# Patient Record
Sex: Male | Born: 1960 | Race: White | Hispanic: No | Marital: Married | Smoking: Never smoker
Health system: Southern US, Community
[De-identification: ages and names within clinical notes are randomized; demographics above are authoritative.]

## PROBLEM LIST (undated history)

## (undated) DIAGNOSIS — H8109 Meniere's disease, unspecified ear: Secondary | ICD-10-CM

## (undated) HISTORY — PX: TONSILLECTOMY: SUR1361

## (undated) HISTORY — PX: COLONOSCOPY: SHX174

---

## 2002-02-16 ENCOUNTER — Inpatient Hospital Stay (HOSPITAL_COMMUNITY): Admission: EM | Admit: 2002-02-16 | Discharge: 2002-02-17 | Payer: Self-pay | Admitting: Emergency Medicine

## 2002-02-16 ENCOUNTER — Encounter: Payer: Self-pay | Admitting: Emergency Medicine

## 2002-03-01 ENCOUNTER — Encounter: Admission: RE | Admit: 2002-03-01 | Discharge: 2002-03-17 | Payer: Self-pay | Admitting: Neurosurgery

## 2012-01-21 ENCOUNTER — Other Ambulatory Visit: Payer: Self-pay | Admitting: Otolaryngology

## 2012-01-21 DIAGNOSIS — H903 Sensorineural hearing loss, bilateral: Secondary | ICD-10-CM

## 2012-01-21 DIAGNOSIS — H905 Unspecified sensorineural hearing loss: Secondary | ICD-10-CM

## 2015-08-02 DIAGNOSIS — R51 Headache: Secondary | ICD-10-CM | POA: Diagnosis not present

## 2015-08-17 DIAGNOSIS — R42 Dizziness and giddiness: Secondary | ICD-10-CM | POA: Diagnosis not present

## 2015-08-17 DIAGNOSIS — H9122 Sudden idiopathic hearing loss, left ear: Secondary | ICD-10-CM | POA: Diagnosis not present

## 2015-08-17 DIAGNOSIS — H903 Sensorineural hearing loss, bilateral: Secondary | ICD-10-CM | POA: Diagnosis not present

## 2015-08-17 DIAGNOSIS — H9201 Otalgia, right ear: Secondary | ICD-10-CM | POA: Diagnosis not present

## 2015-08-17 DIAGNOSIS — H9312 Tinnitus, left ear: Secondary | ICD-10-CM | POA: Diagnosis not present

## 2015-08-21 ENCOUNTER — Other Ambulatory Visit: Payer: Self-pay | Admitting: Otolaryngology

## 2015-08-21 DIAGNOSIS — H9122 Sudden idiopathic hearing loss, left ear: Secondary | ICD-10-CM

## 2015-09-18 DIAGNOSIS — Z23 Encounter for immunization: Secondary | ICD-10-CM | POA: Diagnosis not present

## 2015-11-13 DIAGNOSIS — Z0000 Encounter for general adult medical examination without abnormal findings: Secondary | ICD-10-CM | POA: Diagnosis not present

## 2015-11-13 DIAGNOSIS — H9192 Unspecified hearing loss, left ear: Secondary | ICD-10-CM | POA: Diagnosis not present

## 2015-11-13 DIAGNOSIS — E7800 Pure hypercholesterolemia, unspecified: Secondary | ICD-10-CM | POA: Diagnosis not present

## 2015-11-13 DIAGNOSIS — F5109 Other insomnia not due to a substance or known physiological condition: Secondary | ICD-10-CM | POA: Diagnosis not present

## 2015-11-20 ENCOUNTER — Other Ambulatory Visit: Payer: Self-pay | Admitting: Family Medicine

## 2015-11-20 DIAGNOSIS — H9192 Unspecified hearing loss, left ear: Secondary | ICD-10-CM

## 2015-11-20 DIAGNOSIS — H9312 Tinnitus, left ear: Secondary | ICD-10-CM

## 2015-12-09 ENCOUNTER — Ambulatory Visit
Admission: RE | Admit: 2015-12-09 | Discharge: 2015-12-09 | Disposition: A | Discharge status: Home / Self Care | Payer: Self-pay | Source: Ambulatory Visit | Attending: Family Medicine | Admitting: Family Medicine

## 2015-12-09 DIAGNOSIS — H9312 Tinnitus, left ear: Secondary | ICD-10-CM

## 2015-12-09 DIAGNOSIS — H9192 Unspecified hearing loss, left ear: Secondary | ICD-10-CM

## 2015-12-09 MED ORDER — GADOBENATE DIMEGLUMINE 529 MG/ML IV SOLN
15.0000 mL | Freq: Once | INTRAVENOUS | Status: AC | PRN
  Administered 2015-12-09: 15 mL via INTRAVENOUS

## 2016-06-26 DIAGNOSIS — Z0000 Encounter for general adult medical examination without abnormal findings: Secondary | ICD-10-CM | POA: Diagnosis not present

## 2016-06-26 DIAGNOSIS — Z125 Encounter for screening for malignant neoplasm of prostate: Secondary | ICD-10-CM | POA: Diagnosis not present

## 2016-06-26 DIAGNOSIS — H9192 Unspecified hearing loss, left ear: Secondary | ICD-10-CM | POA: Diagnosis not present

## 2016-06-26 DIAGNOSIS — E7800 Pure hypercholesterolemia, unspecified: Secondary | ICD-10-CM | POA: Diagnosis not present

## 2016-06-26 DIAGNOSIS — F5109 Other insomnia not due to a substance or known physiological condition: Secondary | ICD-10-CM | POA: Diagnosis not present

## 2016-08-03 DIAGNOSIS — R42 Dizziness and giddiness: Secondary | ICD-10-CM | POA: Diagnosis not present

## 2016-08-03 DIAGNOSIS — R404 Transient alteration of awareness: Secondary | ICD-10-CM | POA: Diagnosis not present

## 2016-10-18 DIAGNOSIS — Z23 Encounter for immunization: Secondary | ICD-10-CM | POA: Diagnosis not present

## 2017-03-10 DIAGNOSIS — M25511 Pain in right shoulder: Secondary | ICD-10-CM | POA: Diagnosis not present

## 2017-03-10 DIAGNOSIS — M25512 Pain in left shoulder: Secondary | ICD-10-CM | POA: Diagnosis not present

## 2017-03-10 DIAGNOSIS — M533 Sacrococcygeal disorders, not elsewhere classified: Secondary | ICD-10-CM | POA: Diagnosis not present

## 2017-03-23 DIAGNOSIS — M7701 Medial epicondylitis, right elbow: Secondary | ICD-10-CM | POA: Diagnosis not present

## 2017-03-23 DIAGNOSIS — M25511 Pain in right shoulder: Secondary | ICD-10-CM | POA: Diagnosis not present

## 2017-04-08 DIAGNOSIS — M67911 Unspecified disorder of synovium and tendon, right shoulder: Secondary | ICD-10-CM | POA: Diagnosis not present

## 2017-04-08 DIAGNOSIS — M67912 Unspecified disorder of synovium and tendon, left shoulder: Secondary | ICD-10-CM | POA: Diagnosis not present

## 2017-04-13 DIAGNOSIS — F341 Dysthymic disorder: Secondary | ICD-10-CM | POA: Diagnosis not present

## 2017-04-13 DIAGNOSIS — F411 Generalized anxiety disorder: Secondary | ICD-10-CM | POA: Diagnosis not present

## 2017-04-15 DIAGNOSIS — M7541 Impingement syndrome of right shoulder: Secondary | ICD-10-CM | POA: Diagnosis not present

## 2017-04-15 DIAGNOSIS — M25512 Pain in left shoulder: Secondary | ICD-10-CM | POA: Diagnosis not present

## 2017-04-15 DIAGNOSIS — M25511 Pain in right shoulder: Secondary | ICD-10-CM | POA: Diagnosis not present

## 2017-04-15 DIAGNOSIS — M7542 Impingement syndrome of left shoulder: Secondary | ICD-10-CM | POA: Diagnosis not present

## 2017-04-15 IMAGING — MR MR HEAD WO/W CM
11 series · 37 of 48 positions shown · IV contrast (multihance)
Comparison: None.

CLINICAL DATA: 55-year-old male with 2 months of left side hearing
loss and tinnitus. No known injury. Initial encounter.

EXAM:
MRI HEAD WITHOUT AND WITH CONTRAST
TECHNIQUE: Multiplanar, multiecho pulse sequences of the brain and surrounding
structures were obtained without and with intravenous contrast.
CONTRAST:  15mL MULTIHANCE GADOBENATE DIMEGLUMINE 529 MG/ML IV SOLN

[Series 3: T1 · sagittal · 5.0mm · 0.45mm/px · 4 of 21 slices shown (1 of 3)]
[im 1/21]
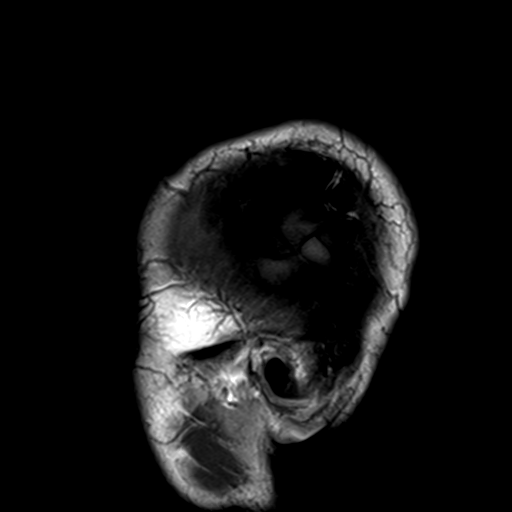
[im 7/21]
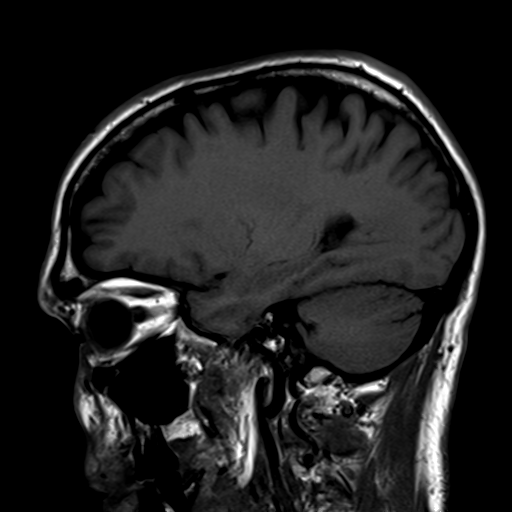
[im 14/21]
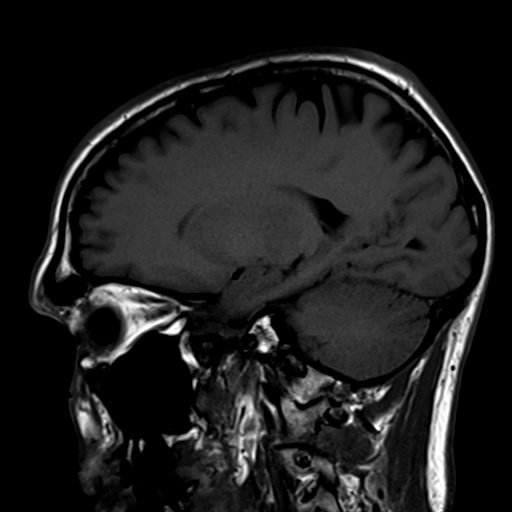
[im 21/21]
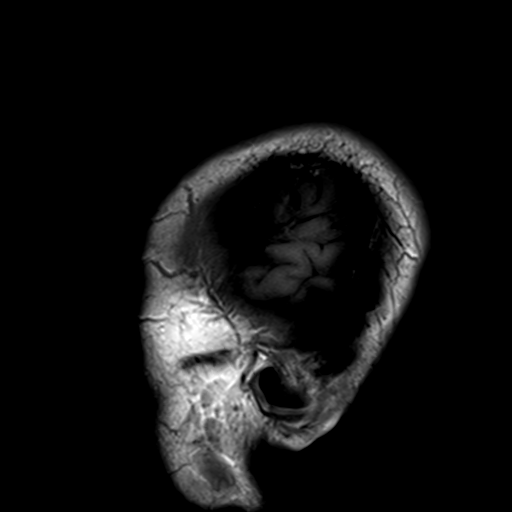

[Series 4: DWI · axial · 3.0mm · 1.80mm/px · z∈[-29,+117]mm · 8 of 100 slices shown (1 of 2)]
[im 1/100]
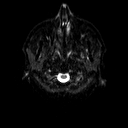
[im 16/100]
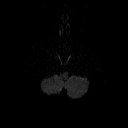
[im 31/100]
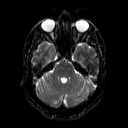
[im 46/100]
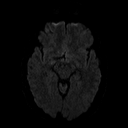
[im 54/100]
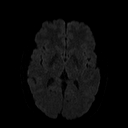
[im 69/100]
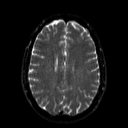
[im 84/100]
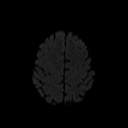
[im 100/100]
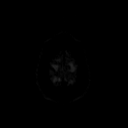

[Series 5: DWI · axial · 3.0mm · 1.80mm/px · z∈[-29,+117]mm · 6 of 48 slices shown (2 of 2)]
[im 1/48]
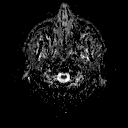
[im 10/48]
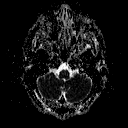
[im 19/48]
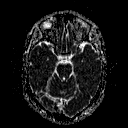
[im 29/48]
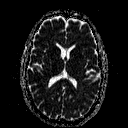
[im 38/48]
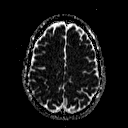
[im 48/48]
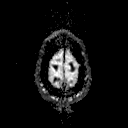

[Series 6: T2 · axial · 5.0mm · 0.45mm/px · z∈[-27,+115]mm · 3 of 23 slices shown]
[im 1/23]
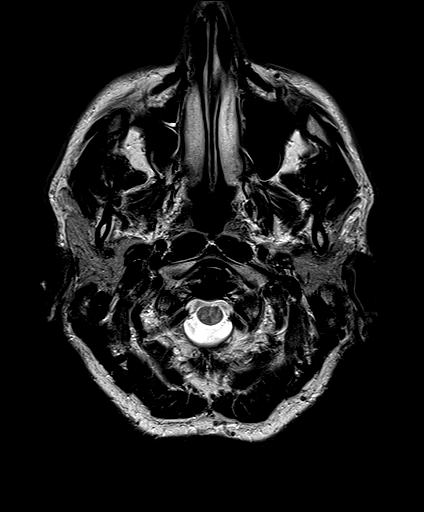
[im 12/23]
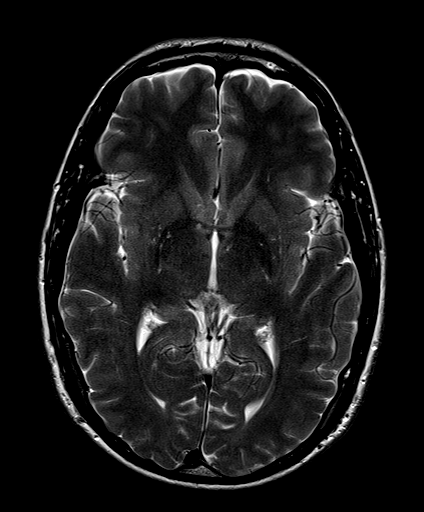
[im 23/23]
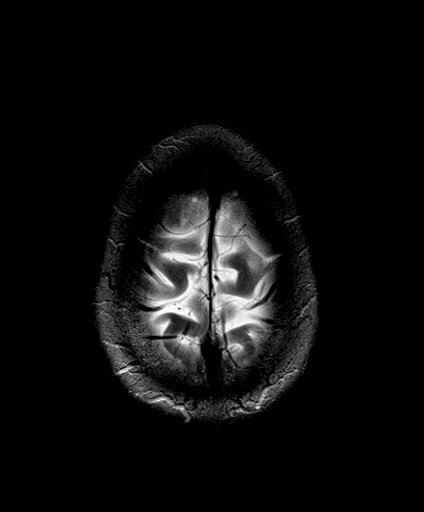

[Series 7: FLAIR · axial · 5.0mm · 0.45mm/px · z∈[-27,+116]mm · 3 of 23 slices shown]
[im 1/23]
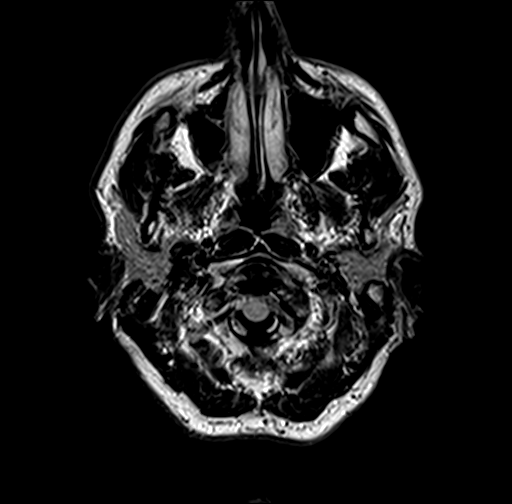
[im 12/23]
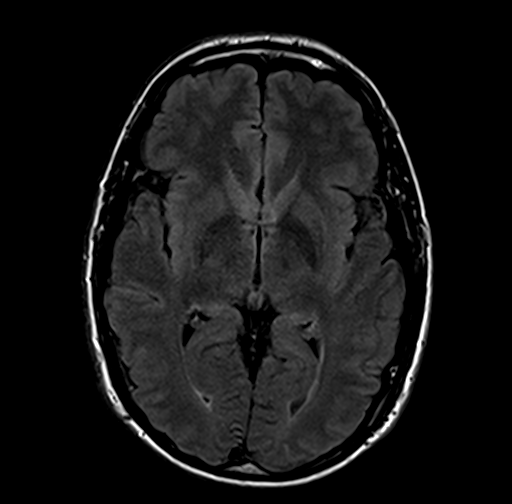
[im 23/23]
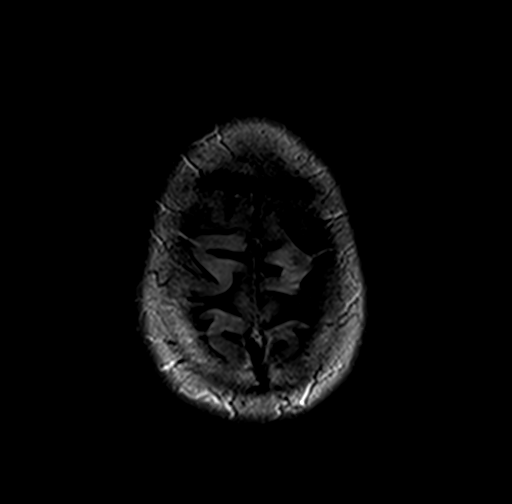

[Series 8: T1 · coronal · 3.0mm · 0.35mm/px · 1 of 11 slices shown (2 of 3)]
[im 1/11]
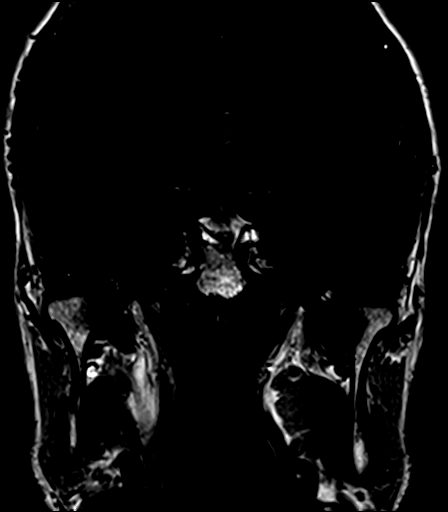

[Series 9: T1 · axial · 3.0mm · 0.35mm/px · 1 of 11 slices shown (3 of 3)]
[im 1/11]
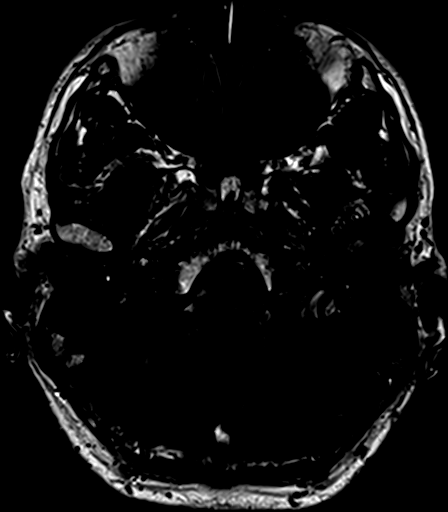

[Series 10: bSSFP · axial · 1.0mm · 0.56mm/px · z∈[-6,+29]mm · 5 of 36 slices shown]
[im 1/36]
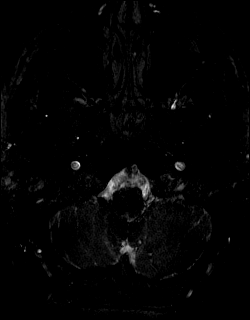
[im 9/36]
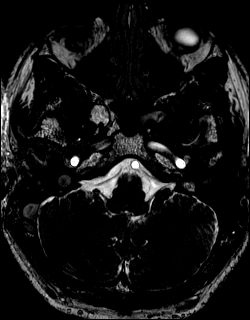
[im 18/36]
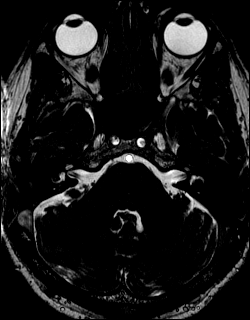
[im 27/36]
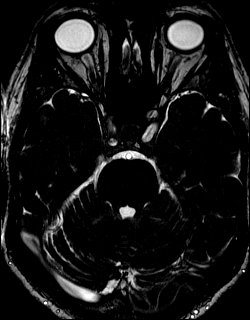
[im 36/36]
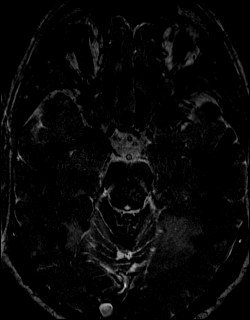

[Series 11: T1 post-contrast · coronal · 3.0mm · 0.35mm/px · 1 of 11 slices shown (1 of 2)]
[im 1/11]
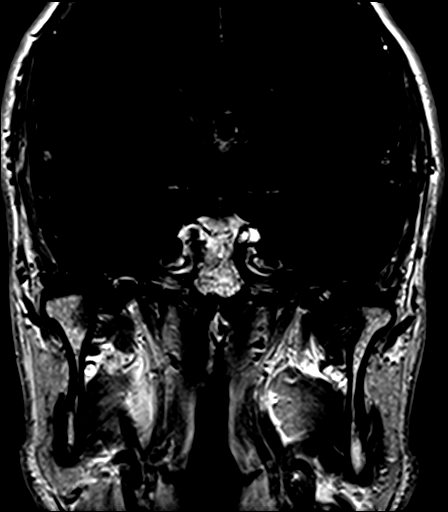

[Series 12: T1 post-contrast · axial · 3.0mm · 0.35mm/px · 1 of 11 slices shown (2 of 2)]
[im 1/11]
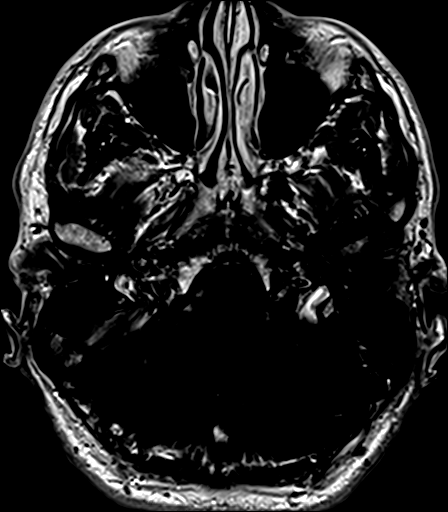

[Series 13: post_t1_mpr_tra · axial · 2.0mm · 0.45mm/px · z∈[-27,+25]mm · 4 of 72 slices shown]
[im 1/72]
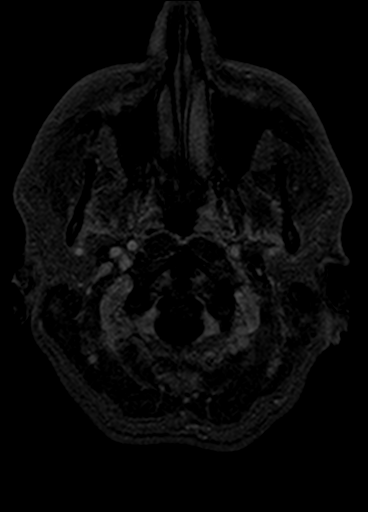
[im 9/72]
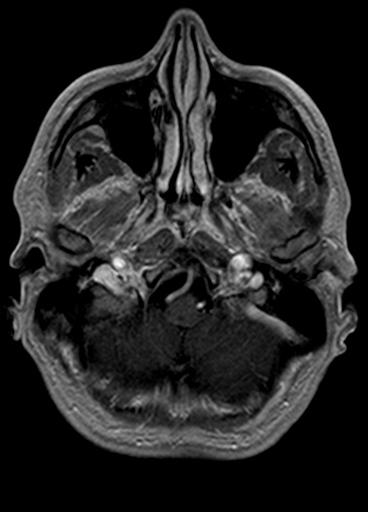
[im 18/72]
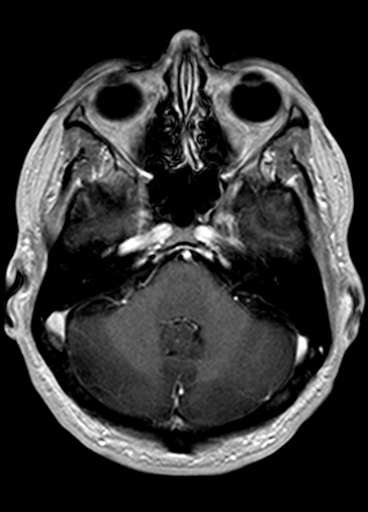
[im 27/72]
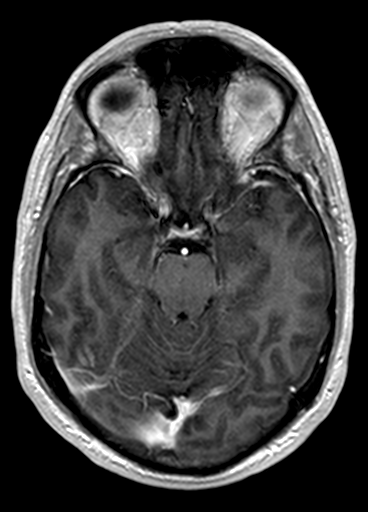

[37 of 48 positions shown; findings below may reference images not displayed]

FINDINGS: Brain: Cerebral volume is normal. No restricted diffusion to suggest
acute infarction. No midline shift, mass effect, evidence of mass
lesion, ventriculomegaly, extra-axial collection or acute
intracranial hemorrhage. Cervicomedullary junction and pituitary are
within normal limits. Gray and white matter signal is within normal
limits throughout the brain. No abnormal enhancement identified.

Vascular: Major intracranial vascular flow voids appear normal.

Skull and upper cervical spine: Normal.  Normal bone marrow signal.

Sinuses/Orbits: Normal orbits soft tissues. Trace ethmoid and left
sphenoid sinus mucosal thickening.

Other: Dedicated internal auditory canal imaging. Normal
cerebellopontine angles. Normal bilateral cisternal and
intracanalicular 7th and 8th cranial nerve segments. Preserved T2
signal in the bilateral cochlea and vestibular structures. No
abnormal enhancement identified. Bilateral mastoid air cells are
clear. Normal stylomastoid foramina. Normal visualized parotid
glands.
IMPRESSION: 1. Negative IAC imaging.
2. Normal MRI appearance of the brain.

## 2017-06-01 DIAGNOSIS — M25512 Pain in left shoulder: Secondary | ICD-10-CM | POA: Diagnosis not present

## 2017-06-01 DIAGNOSIS — M25511 Pain in right shoulder: Secondary | ICD-10-CM | POA: Diagnosis not present

## 2017-06-09 DIAGNOSIS — M25511 Pain in right shoulder: Secondary | ICD-10-CM | POA: Diagnosis not present

## 2017-06-09 DIAGNOSIS — M25512 Pain in left shoulder: Secondary | ICD-10-CM | POA: Diagnosis not present

## 2017-06-24 DIAGNOSIS — M25511 Pain in right shoulder: Secondary | ICD-10-CM | POA: Diagnosis not present

## 2017-06-24 DIAGNOSIS — M25512 Pain in left shoulder: Secondary | ICD-10-CM | POA: Diagnosis not present

## 2017-07-14 DIAGNOSIS — M25511 Pain in right shoulder: Secondary | ICD-10-CM | POA: Diagnosis not present

## 2017-07-14 DIAGNOSIS — M25512 Pain in left shoulder: Secondary | ICD-10-CM | POA: Diagnosis not present

## 2017-07-14 DIAGNOSIS — Z0000 Encounter for general adult medical examination without abnormal findings: Secondary | ICD-10-CM | POA: Diagnosis not present

## 2017-07-14 DIAGNOSIS — E7800 Pure hypercholesterolemia, unspecified: Secondary | ICD-10-CM | POA: Diagnosis not present

## 2017-07-22 DIAGNOSIS — M25512 Pain in left shoulder: Secondary | ICD-10-CM | POA: Diagnosis not present

## 2017-07-22 DIAGNOSIS — M25511 Pain in right shoulder: Secondary | ICD-10-CM | POA: Diagnosis not present

## 2017-08-05 DIAGNOSIS — M25512 Pain in left shoulder: Secondary | ICD-10-CM | POA: Diagnosis not present

## 2017-08-05 DIAGNOSIS — M25511 Pain in right shoulder: Secondary | ICD-10-CM | POA: Diagnosis not present

## 2017-12-01 DIAGNOSIS — H6122 Impacted cerumen, left ear: Secondary | ICD-10-CM | POA: Diagnosis not present

## 2017-12-06 DIAGNOSIS — H6690 Otitis media, unspecified, unspecified ear: Secondary | ICD-10-CM | POA: Diagnosis not present

## 2017-12-06 DIAGNOSIS — J069 Acute upper respiratory infection, unspecified: Secondary | ICD-10-CM | POA: Diagnosis not present

## 2017-12-14 DIAGNOSIS — H93291 Other abnormal auditory perceptions, right ear: Secondary | ICD-10-CM | POA: Diagnosis not present

## 2017-12-14 DIAGNOSIS — H903 Sensorineural hearing loss, bilateral: Secondary | ICD-10-CM | POA: Diagnosis not present

## 2018-04-15 DIAGNOSIS — H6590 Unspecified nonsuppurative otitis media, unspecified ear: Secondary | ICD-10-CM | POA: Diagnosis not present

## 2018-04-15 DIAGNOSIS — H6091 Unspecified otitis externa, right ear: Secondary | ICD-10-CM | POA: Diagnosis not present

## 2018-07-26 DIAGNOSIS — Z0000 Encounter for general adult medical examination without abnormal findings: Secondary | ICD-10-CM | POA: Diagnosis not present

## 2018-08-09 DIAGNOSIS — Z1211 Encounter for screening for malignant neoplasm of colon: Secondary | ICD-10-CM | POA: Diagnosis not present

## 2018-08-09 DIAGNOSIS — E7800 Pure hypercholesterolemia, unspecified: Secondary | ICD-10-CM | POA: Diagnosis not present

## 2018-08-09 DIAGNOSIS — Z125 Encounter for screening for malignant neoplasm of prostate: Secondary | ICD-10-CM | POA: Diagnosis not present

## 2018-08-24 DIAGNOSIS — H43811 Vitreous degeneration, right eye: Secondary | ICD-10-CM | POA: Diagnosis not present

## 2018-09-30 DIAGNOSIS — Z800 Family history of malignant neoplasm of digestive organs: Secondary | ICD-10-CM | POA: Diagnosis not present

## 2018-09-30 DIAGNOSIS — Z6822 Body mass index (BMI) 22.0-22.9, adult: Secondary | ICD-10-CM | POA: Diagnosis not present

## 2018-09-30 DIAGNOSIS — Z803 Family history of malignant neoplasm of breast: Secondary | ICD-10-CM | POA: Diagnosis not present

## 2018-09-30 DIAGNOSIS — Z1371 Encounter for nonprocreative screening for genetic disease carrier status: Secondary | ICD-10-CM | POA: Diagnosis not present

## 2018-10-08 DIAGNOSIS — H3530 Unspecified macular degeneration: Secondary | ICD-10-CM | POA: Diagnosis not present

## 2019-09-23 ENCOUNTER — Other Ambulatory Visit: Payer: Self-pay | Admitting: Surgery

## 2019-10-26 ENCOUNTER — Ambulatory Visit: Payer: Self-pay | Admitting: Surgery

## 2019-10-26 NOTE — H&P (Signed)
Surgical Evaluation   HPI: This is a relatively healthy 59 year old man with a increasingly symptomatic left inguinal hernia. He was evaluated by my partner Dr. Ezzard Standing a couple of months ago and plans were made to proceed with repair, however the patient specifically requests that his surgery be done the day before Thanksgiving, and had to change surgeons to accommodate this. He has never had any abdominal surgery.  He is quite active.  The hernia has always been reducible, and does not cause pain per se but is occasionally uncomfortable.  He would like to have it repaired.  Not on File  Medications: Turmeric  (500MG  Tablet, Oral) Active. B Complex  (Oral) Specific strength unknown - Active. Multivitamin  (Oral) Active.  No past medical history on file.   No family history on file.  Social History   Socioeconomic History   Marital status: Married    Spouse name: Not on file   Number of children: Not on file   Years of education: Not on file   Highest education level: Not on file  Occupational History   Not on file  Tobacco Use   Smoking status: Not on file  Substance and Sexual Activity   Alcohol use: Not on file   Drug use: Not on file   Sexual activity: Not on file  Other Topics Concern   Not on file  Social History Narrative   Not on file   Social Determinants of Health   Financial Resource Strain:    Difficulty of Paying Living Expenses: Not on file  Food Insecurity:    Worried About Running Out of Food in the Last Year: Not on file   Ran Out of Food in the Last Year: Not on file  Transportation Needs:    Lack of Transportation (Medical): Not on file   Lack of Transportation (Non-Medical): Not on file  Physical Activity:    Days of Exercise per Week: Not on file   Minutes of Exercise per Session: Not on file  Stress:    Feeling of Stress : Not on file  Social Connections:    Frequency of Communication with Friends and Family: Not on file   Frequency of  Social Gatherings with Friends and Family: Not on file   Attends Religious Services: Not on file   Active Member of Clubs or Organizations: Not on file   Attends Meetings: Not on file   Marital Status: Not on file    No current outpatient medications on file prior to visit.   No current facility-administered medications on file prior to visit.    Review of Systems: a complete, 10pt review of systems was completed with pertinent positives and negatives as documented in the HPI  Physical Exam: Vitals  Weight: 180.25 lb   Height: 73 in  Body Surface Area: 2.06 m   Body Mass Index: 23.78 kg/m   Temp.: 98.8 F    Pulse: 84 (Regular)    P.OX: 96% (Room air) BP: 160/84(Sitting, Left Arm, Standard)  Reducible left inguinal hernia. There is no hernia on the right by physical exam.   No flowsheet data found.  No flowsheet data found.  No results found for: INR, PROTIME  Imaging: No results found.   A/P: INGUINAL HERNIA OF LEFT SIDE WITHOUT OBSTRUCTION OR GANGRENE (K40.90) Story: Unilateral, none recurrent. Discussed options for repair and advised that my typical algorithm is to proceed with an open repair in this scenario. We discussed the relevant anatomy and  we discussed the technique of the procedure. Discussed risks of bleeding, infection, pain, scarring, injury to structures in the area including nerves, blood vessels, bowel, bladder, risk of chronic pain, hernia recurrence, risk of seroma or hematoma, urinary retention, and risks of general anesthesia including cardiovascular, pulmonary, and thromboembolic complications. Questions were answered. Patient wishes to proceed with scheduling.    There are no problems to display for this patient.      Phylliss Blakes, MD [facility], [place]  See AMION to contact appropriate on-call provider

## 2019-11-23 NOTE — Patient Instructions (Addendum)
DUE TO COVID-19 ONLY ONE VISITOR IS ALLOWED TO COME WITH YOU AND STAY IN THE WAITING ROOM ONLY DURING PRE OP AND PROCEDURE DAY OF SURGERY. THE 1 VISITOR  MAY VISIT WITH YOU AFTER SURGERY IN YOUR PRIVATE ROOM DURING VISITING HOURS ONLY!  YOU NEED TO HAVE A COVID 19 TEST ON: 12/03/19 @ 9:00 AM , THIS TEST MUST BE DONE BEFORE SURGERY,  COVID TESTING SITE [address] [place] [address], IT IS ON THE RIGHT GOING OUT [address] APPROXIMATELY  2 MINUTES PAST [employer] ON THE RIGHT. ONCE YOUR COVID TEST IS COMPLETED,  PLEASE BEGIN THE QUARANTINE INSTRUCTIONS AS OUTLINED IN YOUR HANDOUT.                Jose Hanson    Your procedure is scheduled on: 12/07/19   Report to [facility] Main  Entrance   Report to admitting at: 10:30 AM     Call this number if you have problems the morning of surgery [phone]    Remember: Do not eat solid food :After Midnight. Clear liquids until: 9:30 am.  CLEAR LIQUID DIET   Foods Allowed                                                                     Foods Excluded  Coffee and tea, regular and decaf                             liquids that you cannot  Plain Jell-O any favor except red or purple                                           see through such as: Fruit ices (not with fruit pulp)                                     milk, soups, orange juice  Iced Popsicles                                    All solid food Carbonated beverages, regular and diet                                    Cranberry, grape and apple juices Sports drinks like Gatorade Lightly seasoned clear broth or consume(fat free) Sugar, honey syrup  Sample Menu Breakfast                                Lunch                                     Supper Cranberry juice  Beef broth                            Chicken broth Jell-O                                     Grape juice                           Apple juice Coffee or tea                         Jell-O                                      Popsicle                                                Coffee or tea                        Coffee or tea  _____________________________________________________________________  BRUSH YOUR TEETH MORNING OF SURGERY AND RINSE YOUR MOUTH OUT, NO CHEWING GUM CANDY OR MINTS.  Use Flonase as usual.                           You may not have any metal on your body including hair pins and              piercings  Do not wear jewelry, lotions, powders or perfumes, deodorant             Men may shave face and neck.   Do not bring valuables to the hospital. [facility] IS NOT             RESPONSIBLE   FOR VALUABLES.  Contacts, dentures or bridgework may not be worn into surgery.  Leave suitcase in the car. After surgery it may be brought to your room.     Patients discharged the day of surgery will not be allowed to drive home. IF YOU ARE HAVING SURGERY AND GOING HOME THE SAME DAY, YOU MUST HAVE AN ADULT TO DRIVE YOU HOME AND BE WITH YOU FOR 24 HOURS. YOU MAY GO HOME BY TAXI OR UBER OR ORTHERWISE, BUT AN ADULT MUST ACCOMPANY YOU HOME AND STAY WITH YOU FOR 24 HOURS.  Name and phone number of your driver:  Special Instructions: N/A              Please read over the following fact sheets you were given: _____________________________________________________________________          [facility] - Preparing for Surgery Before surgery, you can play an important role.  Because skin is not sterile, your skin needs to be as free of germs as possible.  You can reduce the number of germs on your skin by washing with CHG (chlorahexidine gluconate) soap before surgery.  CHG is an antiseptic cleaner which kills germs and bonds with the skin to continue killing germs even after washing. Please DO NOT use if you have an allergy to CHG or antibacterial  soaps.  If your skin becomes reddened/irritated stop using the CHG and inform your nurse when you  arrive at Short Stay. Do not shave (including legs and underarms) for at least 48 hours prior to the first CHG shower.  You may shave your face/neck. Please follow these instructions carefully:  1.  Shower with CHG Soap the night before surgery and the  morning of Surgery.  2.  If you choose to wash your hair, wash your hair first as usual with your  normal  shampoo.  3.  After you shampoo, rinse your hair and body thoroughly to remove the  shampoo.                           4.  Use CHG as you would any other liquid soap.  You can apply chg directly  to the skin and wash                       Gently with a scrungie or clean washcloth.  5.  Apply the CHG Soap to your body ONLY FROM THE NECK DOWN.   Do not use on face/ open                           Wound or open sores. Avoid contact with eyes, ears mouth and genitals (private parts).                       Wash face,  Genitals (private parts) with your normal soap.             6.  Wash thoroughly, paying special attention to the area where your surgery  will be performed.  7.  Thoroughly rinse your body with warm water from the neck down.  8.  DO NOT shower/wash with your normal soap after using and rinsing off  the CHG Soap.                9.  Pat yourself dry with a clean towel.            10.  Wear clean pajamas.            11.  Place clean sheets on your bed the night of your first shower and do not  sleep with pets. Day of Surgery : Do not apply any lotions/deodorants the morning of surgery.  Please wear clean clothes to the hospital/surgery center.  FAILURE TO FOLLOW THESE INSTRUCTIONS MAY RESULT IN THE CANCELLATION OF YOUR SURGERY PATIENT SIGNATURE_________________________________  NURSE SIGNATURE__________________________________  ________________________________________________________________________

## 2019-11-24 ENCOUNTER — Other Ambulatory Visit: Payer: Self-pay

## 2019-11-24 ENCOUNTER — Encounter (HOSPITAL_COMMUNITY): Payer: Self-pay

## 2019-11-24 ENCOUNTER — Encounter (HOSPITAL_COMMUNITY)
Admission: RE | Admit: 2019-11-24 | Discharge: 2019-11-24 | Disposition: A | Discharge status: Home / Self Care | Payer: 59 | Source: Ambulatory Visit | Attending: Surgery | Admitting: Surgery

## 2019-11-24 DIAGNOSIS — Z01812 Encounter for preprocedural laboratory examination: Secondary | ICD-10-CM | POA: Diagnosis present

## 2019-11-24 HISTORY — DX: Meniere's disease, unspecified ear: H81.09

## 2019-11-24 LAB — CBC
HCT: 41.8 % (ref 39.0–52.0)
Hemoglobin: 14.1 g/dL (ref 13.0–17.0)
MCH: 31 pg (ref 26.0–34.0)
MCHC: 33.7 g/dL (ref 30.0–36.0)
MCV: 91.9 fL (ref 80.0–100.0)
Platelets: 216 10*3/uL (ref 150–400)
RBC: 4.55 MIL/uL (ref 4.22–5.81)
RDW: 12.9 % (ref 11.5–15.5)
WBC: 5.6 10*3/uL (ref 4.0–10.5)
nRBC: 0 % (ref 0.0–0.2)

## 2019-11-24 NOTE — Progress Notes (Addendum)
COVID Vaccine Completed: Yes Date COVID Vaccine completed: 03/25/19 COVID vaccine manufacturer: Pfizer      PCP - Elias Else Cardiologist -   Chest x-ray -  EKG -  Stress Test -  ECHO -  Cardiac Cath -  Pacemaker/ICD device last checked:  Sleep Study -  CPAP -   Fasting Blood Sugar -  Checks Blood Sugar _____ times a day  Blood Thinner Instructions: Aspirin Instructions: Last Dose:  Anesthesia review:   Patient denies shortness of breath, fever, cough and chest pain at PAT appointment   Patient verbalized understanding of instructions that were given to them at the PAT appointment. Patient was also instructed that they will need to review over the PAT instructions again at home before surgery.

## 2019-11-25 ENCOUNTER — Other Ambulatory Visit (HOSPITAL_COMMUNITY): Payer: Self-pay

## 2019-12-03 ENCOUNTER — Other Ambulatory Visit (HOSPITAL_COMMUNITY)
Admission: RE | Admit: 2019-12-03 | Discharge: 2019-12-03 | Disposition: A | Discharge status: Home / Self Care | Payer: 59 | Source: Ambulatory Visit | Attending: Surgery | Admitting: Surgery

## 2019-12-03 DIAGNOSIS — Z20822 Contact with and (suspected) exposure to covid-19: Secondary | ICD-10-CM | POA: Insufficient documentation

## 2019-12-03 DIAGNOSIS — Z01812 Encounter for preprocedural laboratory examination: Secondary | ICD-10-CM | POA: Diagnosis present

## 2019-12-03 LAB — SARS CORONAVIRUS 2 (TAT 6-24 HRS): SARS Coronavirus 2: NEGATIVE

## 2019-12-06 MED ORDER — BUPIVACAINE LIPOSOME 1.3 % IJ SUSP
20.0000 mL | Freq: Once | INTRAMUSCULAR | Status: DC
  Filled 2019-12-06: qty 20

## 2019-12-06 NOTE — H&P (Signed)
Surgical Evaluation   HPI: This is a relatively healthy 59 year old man with a increasingly symptomatic left inguinal hernia. He was evaluated by my partner Dr. Ezzard Standing a couple of months ago and plans were made to proceed with repair, however the patient specifically requests that his surgery be done the day before Thanksgiving, and had to change surgeons to accommodate this. He has never had any abdominal surgery. He is quite active. The hernia has always been reducible, and does not cause pain per se but is occasionally uncomfortable. He would like to have it repaired.  Not on File  Medications: Turmeric (500MG  Tablet, Oral) Active. B Complex (Oral) Specific strength unknown - Active. Multivitamin (Oral) Active.  No past medical history on file.   No family history on file.  Social History        Socioeconomic History  . Marital status: Married    Spouse name: Not on file  . Number of children: Not on file  . Years of education: Not on file  . Highest education level: Not on file  Occupational History  . Not on file  Tobacco Use  . Smoking status: Not on file  Substance and Sexual Activity  . Alcohol use: Not on file  . Drug use: Not on file  . Sexual activity: Not on file  Other Topics Concern  . Not on file  Social History Narrative  . Not on file   Social Determinants of Health   Financial Resource Strain:   . Difficulty of Paying Living Expenses: Not on file  Food Insecurity:   . Worried About in the Last Year: Not on file  . Ran Out of Food in the Last Year: Not on file  Transportation Needs:   . Lack of Transportation (Medical): Not on file  . Lack of Transportation (Non-Medical): Not on file  Physical Activity:   . Days of Exercise per Week: Not on file  . Minutes of Exercise per Session: Not on file  Stress:   . Feeling of Stress : Not on file  Social Connections:   . Frequency of Communication with Friends and  Family: Not on file  . Frequency of Social Gatherings with Friends and Family: Not on file  . Attends Religious Services: Not on file  . Active Member of Clubs or Organizations: Not on file  . Attends Programme researcher, broadcasting/film/video Meetings: Not on file  . Marital Status: Not on file    No current outpatient medications on file prior to visit.   No current facility-administered medications on file prior to visit.    Review of Systems: a complete, 10pt review of systems was completed with pertinent positives and negatives as documented in the HPI  Physical Exam: Vitals  Weight: 180.25 lb Height: 73in Body Surface Area: 2.06 m Body Mass Index: 23.78 kg/m  Temp.: 98.67F  Pulse: 84 (Regular)  P.OX: 96% (Room air) BP: 160/84(Sitting, Left Arm, Standard)  Reducible left inguinal hernia. There is no hernia on the right by physical exam.   No flowsheet data found.  No flowsheet data found.  Recent Labs  No results found for: INR, PROTIME    Imaging: Imaging Results (Last 48 hours)  No results found.     A/P: INGUINAL HERNIA OF LEFT SIDE WITHOUT OBSTRUCTION OR GANGRENE (K40.90) Story: Unilateral, none recurrent. Discussed options for repair and advised that my typical algorithm is to proceed with an open repair in this scenario. We discussed the relevant anatomy and  we discussed the technique of the procedure. Discussed risks of bleeding, infection, pain, scarring, injury to structures in the area including nerves, blood vessels, bowel, bladder, risk of chronic pain, hernia recurrence, risk of seroma or hematoma, urinary retention, and risks of general anesthesia including cardiovascular, pulmonary, and thromboembolic complications. Questions were answered. Patient wishes to proceed with scheduling.    There are no problems to display for this patient.      Phylliss Blakes, MD [facility], [place]

## 2019-12-07 ENCOUNTER — Ambulatory Visit (HOSPITAL_COMMUNITY): Payer: 59 | Admitting: Anesthesiology

## 2019-12-07 ENCOUNTER — Ambulatory Visit (HOSPITAL_COMMUNITY)
Admission: RE | Admit: 2019-12-07 | Discharge: 2019-12-07 | Disposition: A | Discharge status: Home / Self Care | Payer: 59 | Attending: Surgery | Admitting: Surgery

## 2019-12-07 ENCOUNTER — Encounter (HOSPITAL_COMMUNITY)
Admission: RE | Disposition: A | Discharge status: Home / Self Care | Payer: Self-pay | Source: Home / Self Care | Attending: Surgery

## 2019-12-07 ENCOUNTER — Encounter (HOSPITAL_COMMUNITY): Payer: Self-pay | Admitting: Surgery

## 2019-12-07 DIAGNOSIS — K4090 Unilateral inguinal hernia, without obstruction or gangrene, not specified as recurrent: Secondary | ICD-10-CM | POA: Insufficient documentation

## 2019-12-07 DIAGNOSIS — Z79899 Other long term (current) drug therapy: Secondary | ICD-10-CM | POA: Insufficient documentation

## 2019-12-07 HISTORY — PX: INGUINAL HERNIA REPAIR: SHX194

## 2019-12-07 SURGERY — REPAIR, HERNIA, INGUINAL, ADULT
Anesthesia: General | Laterality: Left

## 2019-12-07 MED ORDER — FENTANYL CITRATE (PF) 250 MCG/5ML IJ SOLN
INTRAMUSCULAR | Status: AC
  Filled 2019-12-07: qty 5

## 2019-12-07 MED ORDER — CHLORHEXIDINE GLUCONATE 4 % EX LIQD: 60.0000 mL | Freq: Once | CUTANEOUS | Status: DC

## 2019-12-07 MED ORDER — MIDAZOLAM HCL 2 MG/2ML IJ SOLN
INTRAMUSCULAR | Status: AC
  Filled 2019-12-07: qty 2

## 2019-12-07 MED ORDER — DEXAMETHASONE SODIUM PHOSPHATE 10 MG/ML IJ SOLN
INTRAMUSCULAR | Status: AC
  Filled 2019-12-07: qty 1

## 2019-12-07 MED ORDER — ACETAMINOPHEN 650 MG RE SUPP: 650.0000 mg | RECTAL | Status: DC | PRN

## 2019-12-07 MED ORDER — OXYCODONE HCL 5 MG/5ML PO SOLN: 5.0000 mg | Freq: Once | ORAL | Status: DC | PRN

## 2019-12-07 MED ORDER — DEXAMETHASONE SODIUM PHOSPHATE 10 MG/ML IJ SOLN
INTRAMUSCULAR | Status: DC | PRN
  Administered 2019-12-07: 10 mg via INTRAVENOUS

## 2019-12-07 MED ORDER — MIDAZOLAM HCL 2 MG/2ML IJ SOLN
INTRAMUSCULAR | Status: DC | PRN
  Administered 2019-12-07 (×2): 1 mg via INTRAVENOUS

## 2019-12-07 MED ORDER — SODIUM CHLORIDE 0.9% FLUSH: 3.0000 mL | Freq: Two times a day (BID) | INTRAVENOUS | Status: DC

## 2019-12-07 MED ORDER — DOCUSATE SODIUM 100 MG PO CAPS: 100.0000 mg | ORAL_CAPSULE | Freq: Two times a day (BID) | ORAL | 0 refills | Status: AC

## 2019-12-07 MED ORDER — SODIUM CHLORIDE 0.9 % IV SOLN: 250.0000 mL | INTRAVENOUS | Status: DC | PRN

## 2019-12-07 MED ORDER — ACETAMINOPHEN 325 MG PO TABS: 325.0000 mg | ORAL_TABLET | ORAL | Status: DC | PRN

## 2019-12-07 MED ORDER — BUPIVACAINE-EPINEPHRINE (PF) 0.25% -1:200000 IJ SOLN
INTRAMUSCULAR | Status: AC
  Filled 2019-12-07: qty 30

## 2019-12-07 MED ORDER — LIDOCAINE 2% (20 MG/ML) 5 ML SYRINGE
INTRAMUSCULAR | Status: DC | PRN
  Administered 2019-12-07: 80 mg via INTRAVENOUS

## 2019-12-07 MED ORDER — PROPOFOL 10 MG/ML IV BOLUS
INTRAVENOUS | Status: AC
  Filled 2019-12-07: qty 20

## 2019-12-07 MED ORDER — FENTANYL CITRATE (PF) 100 MCG/2ML IJ SOLN: 25.0000 ug | INTRAMUSCULAR | Status: DC | PRN

## 2019-12-07 MED ORDER — LIDOCAINE 2% (20 MG/ML) 5 ML SYRINGE
INTRAMUSCULAR | Status: AC
  Filled 2019-12-07: qty 5

## 2019-12-07 MED ORDER — ACETAMINOPHEN 500 MG PO TABS
1000.0000 mg | ORAL_TABLET | ORAL | Status: AC
  Administered 2019-12-07: 1000 mg via ORAL
  Filled 2019-12-07: qty 2

## 2019-12-07 MED ORDER — CHLORHEXIDINE GLUCONATE 0.12 % MT SOLN
15.0000 mL | Freq: Once | OROMUCOSAL | Status: AC
  Administered 2019-12-07: 15 mL via OROMUCOSAL

## 2019-12-07 MED ORDER — ACETAMINOPHEN 325 MG PO TABS: 650.0000 mg | ORAL_TABLET | ORAL | Status: DC | PRN

## 2019-12-07 MED ORDER — FENTANYL CITRATE (PF) 250 MCG/5ML IJ SOLN
INTRAMUSCULAR | Status: DC | PRN
  Administered 2019-12-07 (×2): 50 ug via INTRAVENOUS

## 2019-12-07 MED ORDER — OXYCODONE HCL 5 MG PO TABS: 5.0000 mg | ORAL_TABLET | Freq: Once | ORAL | Status: DC | PRN

## 2019-12-07 MED ORDER — MEPERIDINE HCL 50 MG/ML IJ SOLN: 6.2500 mg | INTRAMUSCULAR | Status: DC | PRN

## 2019-12-07 MED ORDER — GABAPENTIN 300 MG PO CAPS
300.0000 mg | ORAL_CAPSULE | ORAL | Status: AC
  Administered 2019-12-07: 300 mg via ORAL
  Filled 2019-12-07: qty 1

## 2019-12-07 MED ORDER — FENTANYL CITRATE (PF) 100 MCG/2ML IJ SOLN
INTRAMUSCULAR | Status: AC
  Filled 2019-12-07: qty 2

## 2019-12-07 MED ORDER — PROPOFOL 10 MG/ML IV BOLUS
INTRAVENOUS | Status: DC | PRN
  Administered 2019-12-07: 140 mg via INTRAVENOUS

## 2019-12-07 MED ORDER — ORAL CARE MOUTH RINSE: 15.0000 mL | Freq: Once | OROMUCOSAL | Status: AC

## 2019-12-07 MED ORDER — LACTATED RINGERS IV SOLN: INTRAVENOUS | Status: DC

## 2019-12-07 MED ORDER — CEFAZOLIN SODIUM-DEXTROSE 2-4 GM/100ML-% IV SOLN
2.0000 g | INTRAVENOUS | Status: AC
  Administered 2019-12-07: 2 g via INTRAVENOUS
  Filled 2019-12-07: qty 100

## 2019-12-07 MED ORDER — BUPIVACAINE-EPINEPHRINE 0.25% -1:200000 IJ SOLN
INTRAMUSCULAR | Status: DC | PRN
  Administered 2019-12-07: 30 mL

## 2019-12-07 MED ORDER — ONDANSETRON HCL 4 MG/2ML IJ SOLN
INTRAMUSCULAR | Status: AC
  Filled 2019-12-07: qty 2

## 2019-12-07 MED ORDER — ONDANSETRON HCL 4 MG/2ML IJ SOLN: 4.0000 mg | Freq: Once | INTRAMUSCULAR | Status: DC | PRN

## 2019-12-07 MED ORDER — OXYCODONE HCL 5 MG PO TABS: 5.0000 mg | ORAL_TABLET | ORAL | Status: DC | PRN

## 2019-12-07 MED ORDER — OXYCODONE HCL 5 MG PO TABS
5.0000 mg | ORAL_TABLET | Freq: Three times a day (TID) | ORAL | 0 refills | Status: AC | PRN
Start: 2019-12-07 — End: 2020-12-06

## 2019-12-07 MED ORDER — ACETAMINOPHEN 160 MG/5ML PO SOLN: 325.0000 mg | ORAL | Status: DC | PRN

## 2019-12-07 MED ORDER — BUPIVACAINE LIPOSOME 1.3 % IJ SUSP
INTRAMUSCULAR | Status: DC | PRN
  Administered 2019-12-07: 30 mL

## 2019-12-07 MED ORDER — SODIUM CHLORIDE 0.9% FLUSH: 3.0000 mL | INTRAVENOUS | Status: DC | PRN

## 2019-12-07 MED ORDER — ONDANSETRON HCL 4 MG/2ML IJ SOLN
INTRAMUSCULAR | Status: DC | PRN
  Administered 2019-12-07: 4 mg via INTRAVENOUS

## 2019-12-07 SURGICAL SUPPLY — 38 items
BENZOIN TINCTURE PRP APPL 2/3 (GAUZE/BANDAGES/DRESSINGS) ×2 IMPLANT
BLADE SURG 15 STRL LF DISP TIS (BLADE) ×1 IMPLANT
BLADE SURG 15 STRL SS (BLADE) ×2
CHLORAPREP W/TINT 26 (MISCELLANEOUS) ×2 IMPLANT
COVER SURGICAL LIGHT HANDLE (MISCELLANEOUS) ×2 IMPLANT
COVER WAND RF STERILE (DRAPES) IMPLANT
DECANTER SPIKE VIAL GLASS SM (MISCELLANEOUS) ×2 IMPLANT
DRAIN PENROSE 0.5X18 (DRAIN) ×2 IMPLANT
DRAPE LAPAROSCOPIC ABDOMINAL (DRAPES) ×2 IMPLANT
ELECT REM PT RETURN 15FT ADLT (MISCELLANEOUS) ×2 IMPLANT
GAUZE SPONGE 4X4 12PLY STRL (GAUZE/BANDAGES/DRESSINGS) IMPLANT
GLOVE BIO SURGEON STRL SZ 6 (GLOVE) ×6 IMPLANT
GLOVE INDICATOR 6.5 STRL GRN (GLOVE) ×2 IMPLANT
GOWN STRL REUS W/TWL LRG LVL3 (GOWN DISPOSABLE) IMPLANT
GOWN STRL REUS W/TWL XL LVL3 (GOWN DISPOSABLE) ×6 IMPLANT
KIT BASIN OR (CUSTOM PROCEDURE TRAY) ×2 IMPLANT
KIT TURNOVER KIT A (KITS) ×2 IMPLANT
MESH ULTRAPRO 3X6 7.6X15CM (Mesh General) ×2 IMPLANT
NEEDLE HYPO 22GX1.5 SAFETY (NEEDLE) ×2 IMPLANT
PACK BASIC VI WITH GOWN DISP (CUSTOM PROCEDURE TRAY) ×2 IMPLANT
PENCIL SMOKE EVACUATOR (MISCELLANEOUS) ×2 IMPLANT
SPONGE LAP 4X18 RFD (DISPOSABLE) ×2 IMPLANT
STRIP CLOSURE SKIN 1/2X4 (GAUZE/BANDAGES/DRESSINGS) ×2 IMPLANT
SUT ETHIBOND 0 MO6 C/R (SUTURE) ×2 IMPLANT
SUT MNCRL AB 4-0 PS2 18 (SUTURE) ×2 IMPLANT
SUT PDS AB 0 CT1 36 (SUTURE) ×4 IMPLANT
SUT PDS AB 2-0 CT2 27 (SUTURE) ×6 IMPLANT
SUT SILK 3 0 (SUTURE) ×2
SUT SILK 3-0 18XBRD TIE 12 (SUTURE) ×1 IMPLANT
SUT VIC AB 3-0 SH 27 (SUTURE) ×2
SUT VIC AB 3-0 SH 27XBRD (SUTURE) ×2 IMPLANT
SUT VICRYL 0 UR6 27IN ABS (SUTURE) IMPLANT
SUT VICRYL 3 0 BR 18  UND (SUTURE) ×2
SUT VICRYL 3 0 BR 18 UND (SUTURE) ×1 IMPLANT
SYR CONTROL 10ML LL (SYRINGE) ×2 IMPLANT
TOWEL OR 17X26 10 PK STRL BLUE (TOWEL DISPOSABLE) ×2 IMPLANT
TOWEL OR NON WOVEN STRL DISP B (DISPOSABLE) ×2 IMPLANT
TRAY FOLEY MTR SLVR 16FR STAT (SET/KITS/TRAYS/PACK) ×2 IMPLANT

## 2019-12-07 NOTE — Anesthesia Procedure Notes (Signed)
Procedure Name: LMA Insertion Date/Time: 12/07/2019 10:23 AM Performed by: Minerva Ends, CRNA Pre-anesthesia Checklist: Patient identified, Emergency Drugs available, Suction available and Patient being monitored Patient Re-evaluated:Patient Re-evaluated prior to induction Oxygen Delivery Method: Circle System Utilized Preoxygenation: Pre-oxygenation with 100% oxygen Induction Type: IV induction Ventilation: Mask ventilation without difficulty LMA: LMA inserted and LMA with gastric port inserted LMA Size: 5.0 Number of attempts: 1 Placement Confirmation: positive ETCO2 Tube secured with: Tape Dental Injury: Teeth and Oropharynx as per pre-operative assessment  Comments: Smooth IV induction Oddono-- LMA reg #5 inserted-- OK with TV but leaks despite repositioning and air -- removed and #5 pro seal placed with gastic port-- bilat BS Oddono-- no leaks-- bilat BS

## 2019-12-07 NOTE — Discharge Instructions (Signed)
HERNIA REPAIR: POST OP INSTRUCTIONS   EAT Gradually transition to a high fiber diet with a fiber supplement over the next few weeks after discharge.  Start with a pureed / full liquid diet (see below)  WALK Walk an hour a day (cumulative- not all at once).  Control your pain to do that.    CONTROL PAIN Control pain so that you can walk, sleep, tolerate sneezing/coughing, and go up/down stairs.  HAVE A BOWEL MOVEMENT DAILY Keep your bowels regular to avoid problems.  OK to try a laxative to override constipation.  OK to use an antidairrheal to slow down diarrhea.  Call if not better after 2 tries  CALL IF YOU HAVE PROBLEMS/CONCERNS Call if you are still struggling despite following these instructions. Call if you have concerns not answered by these instructions  ######################################################################    1. DIET: Follow a light bland diet & liquids the first 24 hours after arrival home, such as soup, liquids, starches, etc.  Be sure to drink plenty of fluids.  Quickly advance to a usual solid diet within a few days.  Avoid fast food or heavy meals as your are more likely to get nauseated or have irregular bowels.  A low-sugar, high-fiber diet for the rest of your life is ideal.   2. Take your usually prescribed home medications unless otherwise directed.  3. PAIN CONTROL: a. Pain is best controlled by a usual combination of three different methods TOGETHER: i. Ice/Heat ii. Over the counter pain medication iii. Prescription pain medication b. Most patients will experience some swelling and bruising around the hernia(s) such as the bellybutton, groins, or old incisions.  Ice packs or heating pads (30-60 minutes up to 6 times a day) will help. Use ice for the first few days to help decrease swelling and bruising, then switch to heat to help relax tight/sore spots and speed recovery.  Some people prefer to use ice alone, heat alone, alternating between ice &  heat.  Experiment to what works for you.  Swelling and bruising can take several weeks to resolve.   c. It is helpful to take an over-the-counter pain medication regularly for the first days: i. Naproxen (Aleve, etc)  Two 220mg tabs twice a day OR Ibuprofen (Advil, etc) Three 200mg tabs four times a day (every meal & bedtime) AND ii. Acetaminophen (Tylenol, etc) 325-650mg four times a day (every meal & bedtime) d. A  prescription for pain medication should be given to you upon discharge.  Take your pain medication as prescribed, IF NEEDED.  i. If you are having problems/concerns with the prescription medicine (does not control pain, nausea, vomiting, rash, itching, etc), please call us [phone] to see if we need to switch you to a different pain medicine that will work better for you and/or control your side effect better. ii. If you need a refill on your pain medication, please contact your pharmacy.  They will contact our office to request authorization. Prescriptions will not be filled after 5 pm or on week-ends.  4. Avoid getting constipated.  Between the surgery and the pain medications, it is common to experience some constipation.  Increasing fluid intake and taking a fiber supplement (such as Metamucil, Citrucel, FiberCon, MiraLax, etc) 1-2 times a day regularly will usually help prevent this problem from occurring.  A mild laxative (prune juice, Milk of Magnesia, MiraLax, etc) should be taken according to package directions if there are no bowel movements after 48 hours.    5.   Wash / shower every day, starting 2 days after surgery.  You may shower over the steri strips which are waterproof.    6. Remove your outer bandage 2 days after surgery.  You may leave the incision open to air.  You may replace a dressing/Band-Aid to cover an incision for comfort if you wish.  Continue to shower over incision(s) after the dressing is off.  7. ACTIVITIES as tolerated:   a. You may resume regular  (light) daily activities beginning the next day--such as daily self-care, walking, climbing stairs--gradually increasing activities as tolerated.  Control your pain so that you can walk an hour a day.  If you can walk 30 minutes without difficulty, it is safe to try more intense activity such as jogging, treadmill, bicycling, low-impact aerobics, swimming, etc. b. Refrain from the most intensive and strenuous activity such as sit-ups, heavy lifting, contact sports, etc  Refrain from any heavy lifting or straining until 6 weeks after surgery.   c. DO NOT PUSH THROUGH PAIN.  Let pain be your guide: If it hurts to do something, don't do it.  Pain is your body warning you to avoid that activity for another week until the pain goes down. d. You may drive when you are no longer taking prescription pain medication, you can comfortably wear a seatbelt, and you can safely maneuver your car and apply brakes. e. You may have sexual intercourse when it is comfortable.   8. FOLLOW UP in our office a. Please call [facility] at [phone] to set up an appointment to see your surgeon in the office for a follow-up appointment approximately 2-3 weeks after your surgery. b. Make sure that you call for this appointment the day you arrive home to insure a convenient appointment time.  9.  If you have disability of FMLA / Family leave forms, please bring the forms to the office for processing.  (do not give to your surgeon).  WHEN TO CALL US [phone]: 1. Poor pain control 2. Reactions / problems with new medications (rash/itching, nausea, etc)  3. Fever over 101.5 F (38.5 C) 4. Inability to urinate 5. Nausea and/or vomiting 6. Worsening swelling or bruising 7. Continued bleeding from incision. 8. Increased pain, redness, or drainage from the incision   The clinic staff is available to answer your questions during regular business hours (8:30am-5pm).  Please don't hesitate to call and ask to speak to one of our  nurses for clinical concerns.   If you have a medical emergency, go to the nearest emergency room or call 911.  A surgeon from [facility] is always on call at the hospitals in [place]  [facility] [address], [address], [place], [place]  [address] ?  [address] [address], [place], [place]   [address] MAIN: [phone] ? TOLL FREE: [phone] ? FAX: [phone] [email or web address]  

## 2019-12-07 NOTE — Anesthesia Preprocedure Evaluation (Addendum)
Anesthesia Evaluation  Patient identified by MRN, date of birth, ID band Patient awake    Reviewed: Allergy & Precautions, H&P , NPO status , Patient's Chart, lab work & pertinent test results, reviewed documented beta blocker date and time   Airway Mallampati: I  TM Distance: >3 FB Neck ROM: full    Dental no notable dental hx. (+) Teeth Intact, Dental Advisory Given, Chipped   Pulmonary neg pulmonary ROS,    Pulmonary exam normal breath sounds clear to auscultation       Cardiovascular Exercise Tolerance: Good negative cardio ROS   Rhythm:regular Rate:Normal     Neuro/Psych negative neurological ROS  negative psych ROS   GI/Hepatic negative GI ROS, Neg liver ROS,   Endo/Other  negative endocrine ROS  Renal/GU negative Renal ROS  negative genitourinary   Musculoskeletal   Abdominal   Peds  Hematology negative hematology ROS (+)   Anesthesia Other Findings   Reproductive/Obstetrics negative OB ROS                           Anesthesia Physical Anesthesia Plan  ASA: I  Anesthesia Plan: General   Post-op Pain Management:    Induction: Intravenous  PONV Risk Score and Plan: 2 and Ondansetron and Dexamethasone  Airway Management Planned: Oral ETT and LMA  Additional Equipment: None  Intra-op Plan:   Post-operative Plan: Extubation in OR  Informed Consent: I have reviewed the patients History and Physical, chart, labs and discussed the procedure including the risks, benefits and alternatives for the proposed anesthesia with the patient or authorized representative who has indicated his/her understanding and acceptance.     Dental Advisory Given  Plan Discussed with: CRNA and Anesthesiologist  Anesthesia Plan Comments: (Recent diagnosis Meniere's disease.   )      Anesthesia Quick Evaluation

## 2019-12-07 NOTE — Op Note (Signed)
Operative Note  Jose Hanson  [id number]  [id number]  12/07/2019   Surgeon: Berna Bue MD FACS   Assistant: Lannette Donath MD ([id number])   Procedure performed: Open Left inguinal hernia repair with UltraPro mesh   Preop diagnosis:  left inguinal hernia   Post-op diagnosis/intraop findings: direct inguinal hernia   Specimens: none   EBL: 5cc   Complications: none   Description of procedure: After obtaining informed consent, the patient was taken to the operating room and placed supine on operating room table where general anesthesia was initiated, preoperative antibiotics were administered, SCDs applied, and a formal timeout was performed. Foley catheter inserted which is removed at the end of the case. The groin was clipped, prepped and draped in the usual sterile fashion. An oblique incision was made in the just above the inguinal ligament after infiltrating the tissues with local anesthetic. Soft tissues were dissected using electrocautery until the external oblique aponeurosis was encountered. This was divided sharply to expand the external ring. A plane was bluntly developed between the spermatic cord and the external oblique. The ilioinguinal nerve was identified, divided between hemostats and each and ligated with 3-0 Vicryl ties. The spermatic cord was then bluntly dissected away from the pubic tubercle and encircled with a Penrose. Inspection of the inguinal anatomy revealed a moderate direct hernia but no indirect sac. The direct inguinal hernia was reduced, and interrupted figure of eight sutures of 0 Vicryl were used to reapproximate the floor of the inguinal canal to flatten the surface and keep the hernia out of the field. A 3 x 6 piece of ultra Pro mesh was brought onto the field and trimmed to approximate the field. This was sutured to the pubic tubercle fascia, inferior shelving edge and to the internal oblique superiorly with interrupted 2-0 PDS. The tails of the mesh were  wrapped around the spermatic cord, ensuring adequate room for the cord, and sutured to each other with 2-0 PDS, and then directed laterally to lie flat. Hemostasis was ensured within the wound. The Penrose was removed. The external oblique aponeurosis was reapproximated with a running 3-0 Vicryl to re-create a narrowed external ring. More local was infiltrated around the pubic tubercle and in the plane just below the external oblique. The Scarpa's was reapproximated with interrupted 3-0 Vicryls. The skin was closed with a running subcuticular Monocryl. The remainder of the local was injected in the subcutaneous and subcuticular space. The field was then cleaned, benzoin and Steri-Strips and sterile bandage were applied. The patient was then awakened extubated and taken to PACU in stable condition.    All counts were correct at the completion of the case

## 2019-12-08 NOTE — Anesthesia Postprocedure Evaluation (Signed)
Anesthesia Post Note  Patient: Jose Hanson  Procedure(s) Performed: OPEN LEFT INGUINAL HERNIA REPAIR WITH MESH (Left )     Anesthesia Post Evaluation No complications documented.  Last Vitals:  Vitals:   12/07/19 1200 12/07/19 1215  BP: 119/79 118/70  Pulse: 63 (!) 57  Resp: 15 16  Temp: 36.5 C 36.4 C  SpO2: 98% 99%    Last Pain:  Vitals:   12/07/19 1215  TempSrc:   PainSc: 0-No pain                 Lanell Dubie

## 2019-12-08 NOTE — Anesthesia Postprocedure Evaluation (Signed)
Anesthesia Post Note  Patient: Jose Hanson  Procedure(s) Performed: OPEN LEFT INGUINAL HERNIA REPAIR WITH MESH (Left )     Patient location during evaluation: PACU Anesthesia Type: General Level of consciousness: awake and alert Pain management: pain level controlled Vital Signs Assessment: post-procedure vital signs reviewed and stable Respiratory status: spontaneous breathing, nonlabored ventilation, respiratory function stable and patient connected to nasal cannula oxygen Cardiovascular status: blood pressure returned to baseline and stable Postop Assessment: no apparent nausea or vomiting Anesthetic complications: no   No complications documented.  Last Vitals:  Vitals:   12/07/19 1200 12/07/19 1215  BP: 119/79 118/70  Pulse: 63 (!) 57  Resp: 15 16  Temp: 36.5 C 36.4 C  SpO2: 98% 99%    Last Pain:  Vitals:   12/07/19 1215  TempSrc:   PainSc: 0-No pain                 Bennie Scaff

## 2019-12-12 ENCOUNTER — Encounter (HOSPITAL_COMMUNITY): Payer: Self-pay | Admitting: Surgery

## 2019-12-12 NOTE — Transfer of Care (Addendum)
Immediate Anesthesia Transfer of Care Note  Patient: Jose Hanson  Procedure(s) Performed: OPEN LEFT INGUINAL HERNIA REPAIR WITH MESH (Left )  Patient Location: PACU    Anesthesia Type:General  Level of Consciousness: awake  Airway & Oxygen Therapy: Patient Spontanous Breathing  Post-op Assessment: Report given to RN  Post vital signs: stable     Last Vitals:  Vitals Value Taken Time  BP 118/70 12/07/19 1215  Temp 36.4 C 12/07/19 1215  Pulse 57 12/07/19 1215  Resp 16 12/07/19 1215  SpO2 99 % 12/07/19 1215    Last Pain:  Vitals:   12/07/19 1215  TempSrc:   PainSc: 0-No pain         Complications: No complications documented.

## 2019-12-14 NOTE — Anesthesia Procedure Notes (Signed)
Date/Time: 12/07/2019 11:24 AM Performed by: Minerva Ends, CRNA Oxygen Delivery Method: Simple face mask Placement Confirmation: positive ETCO2 and breath sounds checked- equal and bilateral Dental Injury: Teeth and Oropharynx as per pre-operative assessment

## 2019-12-14 NOTE — Addendum Note (Signed)
Addendum  created 12/14/19 1311 by Minerva Ends, CRNA   Child order released for a procedure order, Clinical Note Signed, Intraprocedure Blocks edited, Intraprocedure Event edited

## 2020-07-31 ENCOUNTER — Other Ambulatory Visit: Payer: Self-pay | Admitting: *Deleted

## 2020-07-31 DIAGNOSIS — R55 Syncope and collapse: Secondary | ICD-10-CM

## 2020-07-31 DIAGNOSIS — I499 Cardiac arrhythmia, unspecified: Secondary | ICD-10-CM

## 2020-08-08 ENCOUNTER — Ambulatory Visit (INDEPENDENT_AMBULATORY_CARE_PROVIDER_SITE_OTHER): Payer: 59

## 2020-08-08 DIAGNOSIS — R55 Syncope and collapse: Secondary | ICD-10-CM | POA: Diagnosis not present

## 2020-08-08 DIAGNOSIS — I499 Cardiac arrhythmia, unspecified: Secondary | ICD-10-CM

## 2020-10-05 ENCOUNTER — Ambulatory Visit: Payer: 59 | Admitting: Cardiovascular Disease

## 2020-11-01 ENCOUNTER — Ambulatory Visit: Payer: 59 | Admitting: Cardiology

## 2020-11-01 ENCOUNTER — Encounter: Payer: Self-pay | Admitting: Cardiology

## 2020-11-01 ENCOUNTER — Other Ambulatory Visit: Payer: Self-pay

## 2020-11-01 VITALS — BP 130/71 | HR 61 | Ht 74.0 in | Wt 185.6 lb

## 2020-11-01 DIAGNOSIS — H8109 Meniere's disease, unspecified ear: Secondary | ICD-10-CM

## 2020-11-01 DIAGNOSIS — I4729 Other ventricular tachycardia: Secondary | ICD-10-CM | POA: Diagnosis not present

## 2020-11-01 DIAGNOSIS — R55 Syncope and collapse: Secondary | ICD-10-CM | POA: Diagnosis not present

## 2020-11-01 DIAGNOSIS — E782 Mixed hyperlipidemia: Secondary | ICD-10-CM

## 2020-11-01 NOTE — Patient Instructions (Signed)
Medication Instructions:  Your physician recommends that you continue on your current medications as directed. Please refer to the Current Medication list given to you today.  *If you need a refill on your cardiac medications before your next appointment, please call your pharmacy*   Lab Work: None If you have labs (blood work) drawn today and your tests are completely normal, you will receive your results only by: MyChart Message (if you have MyChart) OR A paper copy in the mail If you have any lab test that is abnormal or we need to change your treatment, we will call you to review the results.   Testing/Procedures: None   Follow-Up: At [facility], you and your health needs are our priority.  As part of our continuing mission to provide you with exceptional heart care, we have created designated Provider Care Teams.  These Care Teams include your primary Cardiologist (physician) and Advanced Practice Providers (APPs -  Physician Assistants and Nurse Practitioners) who all work together to provide you with the care you need, when you need it.  We recommend signing up for the patient portal called "MyChart".  Sign up information is provided on this After Visit Summary.  MyChart is used to connect with patients for Virtual Visits (Telemedicine).  Patients are able to view lab/test results, encounter notes, upcoming appointments, etc.  Non-urgent messages can be sent to your provider as well.   To learn more about what you can do with MyChart, go to [email or web address].    Your next appointment:   6 month(s)  The format for your next appointment:   In Person  Provider:   Thomasene Ripple, DO [address], [place], [place] [address]    Other Instructions Please get an automatic blood pressure cuff. Send blood pressure readings in  MyChart message after 2 weeks.

## 2020-11-01 NOTE — Progress Notes (Signed)
Cardiology Office Note:    Date:  11/01/2020   ID:  Jose Hanson, DOB August 09, 1960, MRN [id number]  PCP:  Elias Else, MD  Cardiologist:  Thomasene Ripple, DO  Electrophysiologist:  None   Referring MD: Elias Else, MD   " I am had a syncope episode"  History of Present Illness:    Jose Hanson is a 60 y.o. male with a hx of Hyperlipidemia, Mnire disease, syncope.  The patient was referred by his primary care doctor to be evaluated for syncope episode that occurred in July 2022.  He tells me he was seen [facility] and was at which time he was going for a walk he had walked about 20 yards when he suddenly passed out.  He had no warning he tells me he was lightheaded at that time.  He does not believe he had any vertigo attack.  He woke up on the ground and was taken to the emergency department in [place].  At the time of his episode he denied any chest pain or palpitations.  He had a mild headache.  He tells me that his blood pressure was low when EMS arrived and based on the ED report from July 22, 2020 it was noted that his blood pressure was 70/20 mmHg which improved rapidly.  In the interim he had had a (30-day monitor ).  The result has been causing him previously.  Since that episode in he has not had any syncope episodes.   Past Medical History:  Diagnosis Date   Meniere's disease     Past Surgical History:  Procedure Laterality Date   COLONOSCOPY     INGUINAL HERNIA REPAIR Left 12/07/2019   Procedure: OPEN LEFT INGUINAL HERNIA REPAIR WITH MESH;  Surgeon: Berna Bue, MD;  Location: [facility];  Service: General;  Laterality: Left;   TONSILLECTOMY      Current Medications: Current Meds  Medication Sig   atorvastatin (LIPITOR) 10 MG tablet Take 10 mg by mouth daily.   b complex vitamins capsule Take 1 capsule by mouth daily.   diphenhydrAMINE (BENADRYL) 25 MG tablet Take 25 mg by mouth every 6 (six) hours as needed for allergies.   fluticasone (FLONASE) 50 MCG/ACT  nasal spray Place 2 sprays into both nostrils at bedtime.   Multiple Vitamins-Minerals (MULTIVITAMIN WITH MINERALS) tablet Take 1 tablet by mouth daily.   Multiple Vitamins-Minerals (PRESERVISION AREDS) CAPS Take 1 capsule by mouth in the morning and at bedtime.   oxyCODONE (ROXICODONE) 5 MG immediate release tablet Take 1 tablet (5 mg total) by mouth every 8 (eight) hours as needed. Alternate tylenol and ibuprofen for the first few days. Take narcotic pain medication only if needed for severe/ breakthrough pain.   triamterene-hydrochlorothiazide (MAXZIDE-25) 37.5-25 MG tablet Take 1 tablet by mouth every morning.   TURMERIC PO Take 1 tablet by mouth daily.   Turmeric POWD Take 1 tablet by mouth daily.   zinc gluconate 50 MG tablet Take 50 mg by mouth daily.     Allergies:   Penicillin g   Social History   Socioeconomic History   Marital status: Married    Spouse name: Not on file   Number of children: Not on file   Years of education: Not on file   Highest education level: Not on file  Occupational History   Not on file  Tobacco Use   Smoking status: Never   Smokeless tobacco: Never  Vaping Use   Vaping Use: Never used  Substance and Sexual Activity   Alcohol use: Yes    Alcohol/week: 5.0 standard drinks    Types: 5 Cans of beer per week    Comment: occas.   Drug use: Not on file   Sexual activity: Not on file  Other Topics Concern   Not on file  Social History Narrative   Not on file   Social Determinants of Health   Financial Resource Strain: Not on file  Food Insecurity: Not on file  Transportation Needs: Not on file  Physical Activity: Not on file  Stress: Not on file  Social Connections: Not on file     Family History: The patient's family history is not on file.  ROS:   Review of Systems  Constitution: Negative for decreased appetite, fever and weight gain.  HENT: Negative for congestion, ear discharge, hoarse voice and sore throat.   Eyes: Negative for  discharge, redness, vision loss in right eye and visual halos.  Cardiovascular: Negative for chest pain, dyspnea on exertion, leg swelling, orthopnea and palpitations.  Respiratory: Negative for cough, hemoptysis, shortness of breath and snoring.   Endocrine: Negative for heat intolerance and polyphagia.  Hematologic/Lymphatic: Negative for bleeding problem. Does not bruise/bleed easily.  Skin: Negative for flushing, nail changes, rash and suspicious lesions.  Musculoskeletal: Negative for arthritis, joint pain, muscle cramps, myalgias, neck pain and stiffness.  Gastrointestinal: Negative for abdominal pain, bowel incontinence, diarrhea and excessive appetite.  Genitourinary: Negative for decreased libido, genital sores and incomplete emptying.  Neurological: Negative for brief paralysis, focal weakness, headaches and loss of balance.  Psychiatric/Behavioral: Negative for altered mental status, depression and suicidal ideas.  Allergic/Immunologic: Negative for HIV exposure and persistent infections.    EKGs/Labs/Other Studies Reviewed:    The following studies were reviewed today:   EKG:  The ekg ordered today demonstrates sinus rhythm 61 bpm with incomplete right bundle branch block data EKG descriptions in July 2020 at the time of his syncopal event no significant change.  30 day monitor  The dominant rhythm is normal sinus rhythm with normal circadian variation. There is physiological nocturnal bradycardia. No significant waking bradycardia or sinus pauses are seen. There is no atrial fibrillation or evidence of supraventricular tachycardia. A single episode of wide-complex tachycardia consistent with nonsustained VT is noted. This was nocturnal on relatively slow, consisting of 2 back-to-back events (4 beat run and 6 beat run) with an average rate of only 100 bpm and a maximum rate of 150 bpm. The episode was asymptomatic. There were no symptom driven recordings.  A single 10 beat run  of relatively slow nocturnal nonsustained ventricular tachycardia is seen. Otherwise normal event monitor.  07/22/2020 CERVICAL DISC LEVELS:  Mild degenerative changes, greatest at C4-5, C5-6, and C6-7.  Moderate to severe right C5-6 neural foraminal narrowing. Moderate bilateral C6-7 and right C4-5 neural foraminal narrowing  OTHER:   Ct 07/22/2020 TECHNIQUE: CT imaging of the cervical spine and head was performed without the use of IV contrast.  Multiplanar reformats were generated from the original data set.  CT dose reduction techniques were utilized including:  automatic exposure control, patient-specific mA and/or kVp settings (based on patient size), and iterative reconstruction.     HEAD CT FINDINGS:  GENERAL: There is no evidence of acute cortical infarction, intracranial hemorrhage, mass effect or edema. Ischemic infarctions can be occult on earlier noncontrast CT.  VENTRICLES: Normal in size and morphology.  BRAIN: Normal-appearing gray and white matter structures for the patient's age.  SKULL/SCALP: Right frontal  soft tissue swelling/injury.  No underlying skull fracture.  MASTOIDS: Visualized portions are well aerated.  ORBITS: Normal.  SINUSES: Limited views demonstrate no significant mucosal thickening or fluid.   CERVICAL SPINE CT FINDINGS:    GENERAL/BONES:  No evidence of fracture or listhesis of the cervical spine.  Normal alignment.  POSTERIOR FOSSA:  Visualized portions are unremarkable.  CRANIOCERVICAL AREA:  Normal for age.   CERVICAL DISC LEVELS:  Mild degenerative changes, greatest at C4-5, C5-6, and C6-7.  Moderate to severe right C5-6 neural foraminal narrowing. Moderate bilateral C6-7 and right C4-5 neural foraminal narrowing  OTHER:   IMPRESSION:  HEAD CT: No acute intracranial abnormality.  CERVICAL SPINE CT: No acute osseous abnormality.   Recent Labs: 11/24/2019: Hemoglobin 14.1; Platelets 216  Recent Lipid Panel No results found for: CHOL, TRIG,  HDL, CHOLHDL, VLDL, LDLCALC, LDLDIRECT  Physical Exam:    VS:  BP 130/71   Pulse 61   Ht 6\' 2"  (1.88 m)   Wt 185 lb 9.6 oz (84.2 kg)   SpO2 97%   BMI 23.83 kg/m     Wt Readings from Last 3 Encounters:  11/01/20 185 lb 9.6 oz (84.2 kg)  12/07/19 178 lb (80.7 kg)  11/24/19 178 lb (80.7 kg)     GEN: Well nourished, well developed in no acute distress HEENT: Normal NECK: No JVD; No carotid bruits LYMPHATICS: No lymphadenopathy CARDIAC: S1S2 noted,RRR, no murmurs, rubs, gallops RESPIRATORY:  Clear to auscultation without rales, wheezing or rhonchi  ABDOMEN: Soft, non-tender, non-distended, +bowel sounds, no guarding. EXTREMITIES: No edema, No cyanosis, no clubbing MUSCULOSKELETAL:  No deformity  SKIN: Warm and dry NEUROLOGIC:  Alert and oriented x 3, non-focal PSYCHIATRIC:  Normal affect, good insight  ASSESSMENT:    1. Syncope and collapse   2. NSVT (nonsustained ventricular tachycardia)   3. Mixed hyperlipidemia   4. Meniere's disease, unspecified laterality    PLAN:    I reviewed the monitor results with the patient.  I explained the findings and his questions has been answered. I do suspect that his episode was in the setting of orthostatic hypotension given his initial blood pressure.  This is highly likely as the patient is also on 2 diuretics (triamterene-hydrochlorothiazide).  I have asked the patient to take his blood pressure daily he is going to get a blood pressure cuff.  We will monitor his blood pressure for now.  Should his blood pressure be dropping closer to 100s or below 100 we discussed cutting back on the diuretics.   For completeness he needs an echocardiogram but the patient wanted to hold off on this until he got his records from the hospital he was treated at July.  I reviewed this record and I do not see echocardiogram.  He will need an echocardiogram to complete his syncope evaluation.  The patient is in agreement with the above plan. The patient  left the office in stable condition.  The patient will follow up in 6 months   Medication Adjustments/Labs and Tests Ordered: Current medicines are reviewed at length with the patient today.  Concerns regarding medicines are outlined above.  Orders Placed This Encounter  Procedures   EKG 12-Lead   No orders of the defined types were placed in this encounter.   Patient Instructions  Medication Instructions:  Your physician recommends that you continue on your current medications as directed. Please refer to the Current Medication list given to you today.  *If you need a refill on your cardiac medications  before your next appointment, please call your pharmacy*   Lab Work: None If you have labs (blood work) drawn today and your tests are completely normal, you will receive your results only by: MyChart Message (if you have MyChart) OR A paper copy in the mail If you have any lab test that is abnormal or we need to change your treatment, we will call you to review the results.   Testing/Procedures: None   Follow-Up: At [facility], you and your health needs are our priority.  As part of our continuing mission to provide you with exceptional heart care, we have created designated Provider Care Teams.  These Care Teams include your primary Cardiologist (physician) and Advanced Practice Providers (APPs -  Physician Assistants and Nurse Practitioners) who all work together to provide you with the care you need, when you need it.  We recommend signing up for the patient portal called "MyChart".  Sign up information is provided on this After Visit Summary.  MyChart is used to connect with patients for Virtual Visits (Telemedicine).  Patients are able to view lab/test results, encounter notes, upcoming appointments, etc.  Non-urgent messages can be sent to your provider as well.   To learn more about what you can do with MyChart, go to [email or web address].    Your next appointment:    6 month(s)  The format for your next appointment:   In Person  Provider:   Thomasene Ripple, DO [address], [place], [place] [address]    Other Instructions Please get an automatic blood pressure cuff. Send blood pressure readings in  MyChart message after 2 weeks.     Adopting a Healthy Lifestyle.  Know what a healthy weight is for you (roughly BMI <25) and aim to maintain this   Aim for 7+ servings of fruits and vegetables daily   65-80+ fluid ounces of water or unsweet tea for healthy kidneys   Limit to max 1 drink of alcohol per day; avoid smoking/tobacco   Limit animal fats in diet for cholesterol and heart health - choose grass fed whenever available   Avoid highly processed foods, and foods high in saturated/trans fats   Aim for low stress - take time to unwind and care for your mental health   Aim for 150 min of moderate intensity exercise weekly for heart health, and weights twice weekly for bone health   Aim for 7-9 hours of sleep daily   When it comes to diets, agreement about the perfect plan isnt easy to find, even among the experts. Experts at the [facility] [employer] developed an idea known as the Healthy Eating Plate. Just imagine a plate divided into logical, healthy portions.   The emphasis is on diet quality:   Load up on vegetables and fruits - one-half of your plate: Aim for color and variety, and remember that potatoes dont count.   Go for whole grains - one-quarter of your plate: Whole wheat, barley, wheat berries, quinoa, oats, brown rice, and foods made with them. If you want pasta, go with whole wheat pasta.   Protein power - one-quarter of your plate: Fish, chicken, beans, and nuts are all healthy, versatile protein sources. Limit red meat.   The diet, however, does go beyond the plate, offering a few other suggestions.   Use healthy plant oils, such as olive, canola, soy, corn, sunflower and peanut. Check the labels, and  avoid partially hydrogenated oil, which have unhealthy trans fats.   If  youre thirsty, drink water. Coffee and tea are good in moderation, but skip sugary drinks and limit milk and dairy products to one or two daily servings.   The type of carbohydrate in the diet is more important than the amount. Some sources of carbohydrates, such as vegetables, fruits, whole grains, and beans-are healthier than others.   Finally, stay active  Signed, Thomasene Ripple, DO  11/01/2020 1:33 PM    [facility]

## 2022-05-12 ENCOUNTER — Emergency Department (HOSPITAL_COMMUNITY)
Admission: EM | Admit: 2022-05-12 | Discharge: 2022-05-12 | Disposition: A | Discharge status: Home / Self Care | Payer: Managed Care, Other (non HMO) | Attending: Emergency Medicine | Admitting: Emergency Medicine

## 2022-05-12 ENCOUNTER — Other Ambulatory Visit: Payer: Self-pay

## 2022-05-12 DIAGNOSIS — Z23 Encounter for immunization: Secondary | ICD-10-CM | POA: Insufficient documentation

## 2022-05-12 DIAGNOSIS — Z203 Contact with and (suspected) exposure to rabies: Secondary | ICD-10-CM | POA: Diagnosis not present

## 2022-05-12 MED ORDER — RABIES IMMUNE GLOBULIN 150 UNIT/ML IM INJ
20.0000 [IU]/kg | INJECTION | Freq: Once | INTRAMUSCULAR | Status: DC
  Filled 2022-05-12: qty 12

## 2022-05-12 MED ORDER — RABIES VACCINE, PCEC IM SUSR
1.0000 mL | Freq: Once | INTRAMUSCULAR | Status: AC
  Administered 2022-05-12: 1 mL via INTRAMUSCULAR
  Filled 2022-05-12: qty 1

## 2022-05-12 NOTE — ED Triage Notes (Signed)
Pt was on a work trip in [place] where he was possibly exposed to bats. Denies being bit by anything, but work states that he needs to come get rabies vaccination. Denies any other complaints/symptoms.

## 2022-05-12 NOTE — ED Notes (Signed)
[place] [facility] [employer] notified of pt via phone by this RN, representative will call if further measures beyond Rabies vaccine needed.

## 2022-05-12 NOTE — ED Provider Notes (Signed)
[place] EMERGENCY DEPARTMENT AT [facility] Provider Note   CSN: [id number] Arrival date & time: 05/12/22  2014     History  Chief Complaint  Patient presents with   Rabies Vaccination    Jose Hanson is a 62 y.o. male.  62 year old male presents for need of rabies vaccination.  He was on a trip to [place].  He does not recall being bit by the bat.  Bats were flying around the room.  No other complaints.  The history is provided by the patient. No language interpreter was used.       Home Medications Prior to Admission medications   Medication Sig Start Date End Date Taking? Authorizing Provider  atorvastatin (LIPITOR) 10 MG tablet Take 10 mg by mouth daily. 10/30/20   [provider]  b complex vitamins capsule Take 1 capsule by mouth daily.    [provider]  diphenhydrAMINE (BENADRYL) 25 MG tablet Take 25 mg by mouth every 6 (six) hours as needed for allergies.    [provider]  fluticasone (FLONASE) 50 MCG/ACT nasal spray Place 2 sprays into both nostrils at bedtime. 10/24/19   [provider]  Multiple Vitamins-Minerals (MULTIVITAMIN WITH MINERALS) tablet Take 1 tablet by mouth daily.    [provider]  Multiple Vitamins-Minerals (PRESERVISION AREDS) CAPS Take 1 capsule by mouth in the morning and at bedtime.    [provider]  triamterene-hydrochlorothiazide (MAXZIDE-25) 37.5-25 MG tablet Take 1 tablet by mouth every morning. 10/06/20   [provider]  TURMERIC PO Take 1 tablet by mouth daily.    [provider]  Turmeric POWD Take 1 tablet by mouth daily.    [provider]  zinc gluconate 50 MG tablet Take 50 mg by mouth daily.    [provider]      Allergies    Penicillin g    Review of Systems   Review of Systems  Skin:  Negative for wound.  All other systems reviewed and are negative.   Physical Exam Updated Vital Signs BP 139/74 (BP  Location: Right Arm)   Pulse 64   Temp 98.1 F (36.7 C) (Oral)   Resp 16   Ht 6\' 2"  (1.88 m)   Wt 81.6 kg   SpO2 97%   BMI 23.11 kg/m  Physical Exam Vitals and nursing note reviewed.  Constitutional:      General: He is not in acute distress.    Appearance: Normal appearance. He is not ill-appearing.  HENT:     Head: Normocephalic and atraumatic.     Nose: Nose normal.  Eyes:     Conjunctiva/sclera: Conjunctivae normal.  Pulmonary:     Effort: Pulmonary effort is normal. No respiratory distress.  Musculoskeletal:        General: No deformity.  Skin:    Findings: No rash.  Neurological:     Mental Status: He is alert.     ED Results / Procedures / Treatments   Labs (all labs ordered are listed, but only abnormal results are displayed) Labs Reviewed - No data to display  EKG None  Radiology No results found.  Procedures Procedures    Medications Ordered in ED Medications  rabies immune globulin (HYPERRAB/KEDRAB) injection 1,650 Units (1,650 Units Intramuscular Not Given 05/12/22 2228)  rabies vaccine (RABAVERT) injection 1 mL (1 mL Intramuscular Given 05/12/22 2223)    ED Course/ Medical Decision Making/ A&P  Medical Decision Making Risk Prescription drug management.   62 year old male presents for need of rabies vaccination.  No other complaints.  No evidence of bites.  Rabies vaccination given.  Rabies vaccination schedule discussed.  Patient voices understanding and is in agreement with plan.  He is appropriate for discharge.  Final Clinical Impression(s) / ED Diagnoses Final diagnoses:  Need for rabies vaccination    Rx / DC Orders ED Discharge Orders     None         Marita Kansas, PA-C 05/12/22 2258    Virgina Norfolk, DO 05/12/22 2314

## 2022-05-12 NOTE — Discharge Instructions (Signed)
You were given the rabies vaccination today.  This will need to be repeated on days 3, 7, 14, and 28.  You can have these repeated at your primary care provider's office, or in the urgent care.  For any concerning symptoms return to the emergency department.

## 2022-05-12 NOTE — ED Notes (Signed)
Animal Control Exposure form faxed to [phone]

## 2022-05-15 ENCOUNTER — Ambulatory Visit: Payer: Managed Care, Other (non HMO) | Admitting: Internal Medicine

## 2022-05-15 ENCOUNTER — Other Ambulatory Visit: Payer: Self-pay

## 2022-05-15 DIAGNOSIS — Z203 Contact with and (suspected) exposure to rabies: Secondary | ICD-10-CM

## 2022-05-15 DIAGNOSIS — Z23 Encounter for immunization: Secondary | ICD-10-CM | POA: Diagnosis not present

## 2022-05-15 MED ORDER — RABIES IMMUNE GLOBULIN 150 UNIT/ML IM INJ
20.0000 [IU]/kg | INJECTION | Freq: Once | INTRAMUSCULAR | Status: AC
Start: 2022-05-15 — End: 2022-05-15
  Administered 2022-05-15: 1650 [IU]

## 2022-05-15 NOTE — Progress Notes (Signed)
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 During trip, he had  bat exposure in cabin in the day and evenings around 4/22 4/23. Does not suspect having a bite however still had exposure with being in cabin at night. Per [facility] and CDC rabies guidance, constitutes non-bite bat exposure requiring PEP.   first dose of rabies vaccine given on 05/12/22 at ED, didn't sleep well and sweats.  now here for 2nd dose of rabies vaccine. No side effects from vaccine or HRIG  No new symptoms Active Ambulatory Problems    Diagnosis Date Noted   NSVT (nonsustained ventricular tachycardia) (HCC) 11/01/2020   Syncope and collapse 11/01/2020   Mixed hyperlipidemia 11/01/2020   Resolved Ambulatory Problems    Diagnosis Date Noted   No Resolved Ambulatory Problems   Past Medical History:  Diagnosis Date   Meniere's disease     Review of Systems 12 point ros is negative  Physical Exam  Did not perform   Assessment and Plan Rabies exposure, non-bite, requiring post exposure prophylaxis-management (PEP) = Will give 2nd dose of rabies vaccine as part of PEP.  Plan for right IM deltoid injection.  Will have them come back for Day #7 and #14 to complete rabies vaccine series  Gave precautions, to call if any questions

## 2022-05-19 ENCOUNTER — Ambulatory Visit (INDEPENDENT_AMBULATORY_CARE_PROVIDER_SITE_OTHER): Payer: Managed Care, Other (non HMO)

## 2022-05-19 ENCOUNTER — Other Ambulatory Visit: Payer: Self-pay

## 2022-05-19 DIAGNOSIS — Z203 Contact with and (suspected) exposure to rabies: Secondary | ICD-10-CM | POA: Diagnosis not present

## 2022-05-19 DIAGNOSIS — Z23 Encounter for immunization: Secondary | ICD-10-CM | POA: Diagnosis not present

## 2022-05-19 MED ORDER — RABIES VACCINE, PCEC IM SUSR
1.0000 mL | Freq: Once | INTRAMUSCULAR | Status: AC
Start: 2022-05-19 — End: 2022-05-19
  Administered 2022-05-19: 1 mL via INTRAMUSCULAR

## 2022-05-19 NOTE — Progress Notes (Signed)
HPI  bat exposure in cabin in evenings while on [place] trip roughly around 4/23. Does not suspect having a bite however still had exposure with being in cabin at night. Per [facility] and CDC rabies guidance, constitutes non-bite bat exposure requiring PEP.   first dose of rabies vaccine and HRIG given on 05/12/2022 at ED,   now here for 3rd dose vaccine. No side effects from vaccine or HRIG  No new symptoms   Review of Systems 12 point ros is negative  Physical Exam    Assessment and Plan Rabies exposure, non-bite, requiring post exposure prophylaxis-management (PEP) = Will give  3rd dose of rabies vaccine as part of PEP.  Plan for left IM deltoid injection.  Will have them come back for Day  #14 to complete rabies vaccine series  Gave precautions, to call if any questions

## 2022-06-02 ENCOUNTER — Ambulatory Visit (INDEPENDENT_AMBULATORY_CARE_PROVIDER_SITE_OTHER): Payer: Managed Care, Other (non HMO)

## 2022-06-02 ENCOUNTER — Other Ambulatory Visit: Payer: Self-pay

## 2022-06-02 DIAGNOSIS — Z23 Encounter for immunization: Secondary | ICD-10-CM | POA: Diagnosis not present

## 2022-06-02 DIAGNOSIS — B20 Human immunodeficiency virus [HIV] disease: Secondary | ICD-10-CM

## 2022-06-02 DIAGNOSIS — Z203 Contact with and (suspected) exposure to rabies: Secondary | ICD-10-CM | POA: Diagnosis not present

## 2022-06-02 NOTE — Progress Notes (Signed)
HPI Patient with history of recent bat exposure  Per [facility] and CDC rabies guidance, this constitutes non-bite bat exposure requiring PEP.   now here for 4 dose vaccine, as part of rabies vaccine series ( day 0, day 3, day 7 and day 14 ) since initiation of PEP.   Since last dose of vaccine: NO (no, I did not have  or yes, I had )side effects from vaccine  Today, they have NO new symptoms  Assessment and Plan Rabies exposure, non-bite, requiring post exposure prophylaxis-management (PEP) = Will give 4TH  dose of rabies vaccine as part of PEP.  Plan for IM deltoid injection.  Will have them come back to finish PEP series ( day 3, 7, 14) to complete rabies vaccine series  Gave precautions, to call if any questions

## 2022-10-24 ENCOUNTER — Other Ambulatory Visit (HOSPITAL_BASED_OUTPATIENT_CLINIC_OR_DEPARTMENT_OTHER): Payer: Self-pay

## 2022-10-24 MED ORDER — COVID-19 MRNA VAC-TRIS(PFIZER) 30 MCG/0.3ML IM SUSY
0.3000 mL | PREFILLED_SYRINGE | Freq: Once | INTRAMUSCULAR | 0 refills | Status: DC
  Filled 2022-10-24: qty 0.3, 1d supply, fill #0

## 2022-10-24 MED ORDER — INFLUENZA VIRUS VACC SPLIT PF (FLUZONE) 0.5 ML IM SUSY
0.5000 mL | PREFILLED_SYRINGE | Freq: Once | INTRAMUSCULAR | 0 refills | Status: AC
  Filled 2022-10-24: qty 0.5, 1d supply, fill #0

## 2022-10-24 MED ORDER — COVID-19 MRNA VAC-TRIS(PFIZER) 30 MCG/0.3ML IM SUSY
0.3000 mL | PREFILLED_SYRINGE | Freq: Once | INTRAMUSCULAR | 0 refills | Status: AC
  Filled 2022-10-24: qty 0.3, 1d supply, fill #0

## 2023-02-25 ENCOUNTER — Other Ambulatory Visit: Payer: Self-pay | Admitting: Medical Genetics

## 2023-05-01 ENCOUNTER — Other Ambulatory Visit

## 2023-05-01 DIAGNOSIS — Z006 Encounter for examination for normal comparison and control in clinical research program: Secondary | ICD-10-CM

## 2023-05-22 LAB — GENECONNECT MOLECULAR SCREEN

## 2023-05-26 ENCOUNTER — Telehealth: Payer: Self-pay | Admitting: Medical Genetics

## 2023-05-26 ENCOUNTER — Ambulatory Visit: Payer: Self-pay

## 2023-05-27 ENCOUNTER — Other Ambulatory Visit: Payer: Self-pay | Admitting: Medical Genetics

## 2023-05-27 DIAGNOSIS — Z006 Encounter for examination for normal comparison and control in clinical research program: Secondary | ICD-10-CM

## 2023-05-27 NOTE — Progress Notes (Signed)
 Initial result was a TNP. New order requested. Confirmed consent on file.

## 2023-05-27 NOTE — Telephone Encounter (Signed)
 [facility]  05/27/2023 12:14 PM  Confirmed I was speaking with Jose Hanson [phone] by using name and DOB. Informed participant the reason for this call is to follow-up on a recent sample the participant provided at one of the [facility] lab locations. Informed participant the test was not able to be completed with this sample and apologized for the inconvenience. Participant was requested to provide a new sample at one of our participating labs at no cost so that participant can continue participation and receive test results. Informed participant they do not need to be fasting and if there are other samples that need to be drawn, they can be done at the same visit. Participant has not had a blood transfusion or blood product in the last 30 days. Participant agreed to provide another sample. Participant was provided the [employer] program website to learn why this may have happened. Participant was thanked for their time and continued support of the above study.    Jordyn Pennstrom, BS [place]  [facility] Clinical Research Specialist II Direct Dial: [phone]  Fax: [phone]

## 2023-06-23 ENCOUNTER — Other Ambulatory Visit (HOSPITAL_COMMUNITY)
Admission: RE | Admit: 2023-06-23 | Discharge: 2023-06-23 | Disposition: A | Discharge status: Home / Self Care | Payer: Self-pay | Source: Ambulatory Visit | Attending: Medical Genetics | Admitting: Medical Genetics

## 2023-06-23 DIAGNOSIS — Z006 Encounter for examination for normal comparison and control in clinical research program: Secondary | ICD-10-CM | POA: Insufficient documentation

## 2023-07-01 LAB — GENECONNECT MOLECULAR SCREEN: Genetic Analysis Overall Interpretation: NEGATIVE
